# Patient Record
Sex: Male | Born: 1960
Health system: Southern US, Community
[De-identification: ages and names within clinical notes are randomized; demographics above are authoritative.]

## PROBLEM LIST (undated history)

## (undated) DIAGNOSIS — I2510 Atherosclerotic heart disease of native coronary artery without angina pectoris: Secondary | ICD-10-CM

## (undated) DIAGNOSIS — I219 Acute myocardial infarction, unspecified: Secondary | ICD-10-CM

## (undated) DIAGNOSIS — IMO0002 Reserved for concepts with insufficient information to code with codable children: Secondary | ICD-10-CM

## (undated) DIAGNOSIS — E785 Hyperlipidemia, unspecified: Secondary | ICD-10-CM

## (undated) DIAGNOSIS — K219 Gastro-esophageal reflux disease without esophagitis: Secondary | ICD-10-CM

## (undated) HISTORY — DX: Reserved for concepts with insufficient information to code with codable children: IMO0002

## (undated) HISTORY — DX: Atherosclerotic heart disease of native coronary artery without angina pectoris: I25.10

## (undated) HISTORY — DX: Acute myocardial infarction, unspecified: I21.9

## (undated) HISTORY — DX: Gastro-esophageal reflux disease without esophagitis: K21.9

## (undated) HISTORY — DX: Hyperlipidemia, unspecified: E78.5

---

## 2004-03-14 HISTORY — PX: APPENDECTOMY: SHX54

## 2004-12-24 ENCOUNTER — Ambulatory Visit: Payer: Self-pay | Admitting: Family Medicine

## 2005-02-07 ENCOUNTER — Ambulatory Visit: Payer: Self-pay | Admitting: Family Medicine

## 2005-05-09 ENCOUNTER — Ambulatory Visit: Payer: Self-pay | Admitting: Family Medicine

## 2005-08-05 ENCOUNTER — Ambulatory Visit: Payer: Self-pay | Admitting: Family Medicine

## 2005-08-30 ENCOUNTER — Ambulatory Visit: Payer: Self-pay | Admitting: Family Medicine

## 2005-10-10 DIAGNOSIS — I252 Old myocardial infarction: Secondary | ICD-10-CM

## 2005-10-14 ENCOUNTER — Ambulatory Visit: Payer: Self-pay | Admitting: *Deleted

## 2005-10-14 ENCOUNTER — Inpatient Hospital Stay (HOSPITAL_COMMUNITY): Admission: EM | Admit: 2005-10-14 | Discharge: 2005-10-18 | Payer: Self-pay | Admitting: Emergency Medicine

## 2005-10-14 ENCOUNTER — Ambulatory Visit: Payer: Self-pay | Admitting: Family Medicine

## 2005-10-15 ENCOUNTER — Encounter: Payer: Self-pay | Admitting: Internal Medicine

## 2005-10-17 DIAGNOSIS — I219 Acute myocardial infarction, unspecified: Secondary | ICD-10-CM

## 2005-10-17 HISTORY — DX: Acute myocardial infarction, unspecified: I21.9

## 2005-10-26 ENCOUNTER — Ambulatory Visit: Payer: Self-pay | Admitting: Cardiology

## 2005-12-20 DIAGNOSIS — I219 Acute myocardial infarction, unspecified: Secondary | ICD-10-CM

## 2005-12-20 DIAGNOSIS — IMO0002 Reserved for concepts with insufficient information to code with codable children: Secondary | ICD-10-CM | POA: Insufficient documentation

## 2005-12-20 DIAGNOSIS — E785 Hyperlipidemia, unspecified: Secondary | ICD-10-CM

## 2005-12-20 DIAGNOSIS — K219 Gastro-esophageal reflux disease without esophagitis: Secondary | ICD-10-CM

## 2005-12-20 DIAGNOSIS — I2510 Atherosclerotic heart disease of native coronary artery without angina pectoris: Secondary | ICD-10-CM

## 2005-12-20 DIAGNOSIS — Z9861 Coronary angioplasty status: Secondary | ICD-10-CM

## 2005-12-20 HISTORY — DX: Atherosclerotic heart disease of native coronary artery without angina pectoris: I25.10

## 2005-12-20 HISTORY — DX: Reserved for concepts with insufficient information to code with codable children: IMO0002

## 2005-12-20 HISTORY — DX: Acute myocardial infarction, unspecified: I21.9

## 2005-12-20 HISTORY — DX: Hyperlipidemia, unspecified: E78.5

## 2005-12-20 HISTORY — DX: Gastro-esophageal reflux disease without esophagitis: K21.9

## 2006-04-14 ENCOUNTER — Ambulatory Visit: Payer: Self-pay | Admitting: Family Medicine

## 2006-05-29 ENCOUNTER — Encounter: Payer: Self-pay | Admitting: Family Medicine

## 2006-08-31 ENCOUNTER — Encounter: Payer: Self-pay | Admitting: Family Medicine

## 2006-12-04 ENCOUNTER — Encounter: Payer: Self-pay | Admitting: Family Medicine

## 2006-12-04 DIAGNOSIS — E785 Hyperlipidemia, unspecified: Secondary | ICD-10-CM | POA: Insufficient documentation

## 2007-01-25 ENCOUNTER — Ambulatory Visit: Payer: Self-pay | Admitting: Family Medicine

## 2007-04-17 ENCOUNTER — Encounter: Payer: Self-pay | Admitting: Family Medicine

## 2007-12-14 ENCOUNTER — Encounter: Payer: Self-pay | Admitting: Family Medicine

## 2007-12-20 ENCOUNTER — Ambulatory Visit: Payer: Self-pay | Admitting: Family Medicine

## 2008-06-12 ENCOUNTER — Encounter: Payer: Self-pay | Admitting: Family Medicine

## 2009-01-27 ENCOUNTER — Encounter: Payer: Self-pay | Admitting: Family Medicine

## 2009-02-25 DIAGNOSIS — I2510 Atherosclerotic heart disease of native coronary artery without angina pectoris: Secondary | ICD-10-CM | POA: Insufficient documentation

## 2009-04-21 ENCOUNTER — Encounter: Payer: Self-pay | Admitting: Family Medicine

## 2009-10-17 ENCOUNTER — Ambulatory Visit: Payer: Self-pay | Admitting: Emergency Medicine

## 2009-10-17 DIAGNOSIS — J029 Acute pharyngitis, unspecified: Secondary | ICD-10-CM | POA: Insufficient documentation

## 2010-04-13 NOTE — Assessment & Plan Note (Signed)
Summary: cough-yellowish, sorethroat x 2 dys rm 3   Vital Signs:  Patient Profile:   50 Years Old Male CC:      Cold & URI symptoms Height:     64.5 inches Weight:      158 pounds O2 Sat:      100 % O2 treatment:    Room Air Temp:     98.1 degrees F oral Pulse rate:   85 / minute Pulse rhythm:   regular Resp:     16 per minute BP sitting:   114 / 80  (right arm) Cuff size:   regular  Vitals Entered By: Areta Haber CMA (October 17, 2009 4:00 PM)                  Current Allergies: No known allergies History of Present Illness Chief Complaint: Cold & URI symptoms History of Present Illness: ST and HA and mild fever for 2-3 days.  Just flew back from [place] where he was on vacation.  Very sore throat.  Chloraseptic spray and Theraflu and Delsym help a little bit.  No F/C/N/V.  No CP, SOB.  Current Problems: PHARYNGITIS (ICD-462) MYOCARDIAL INFARCTION, INITIAL EPISODE OF CARE (ICD-410.90) KNEE/LEG SPRAIN/STRAIN:, UNSPECIFIED (ICD-844.9) HYPERLIPIDEMIA (ICD-272.4) GASTROESOPHAGEAL REFLUX, NO ESOPHAGITIS (ICD-530.81) BACK PAIN W/RADIATION, UNSPECIFIED (ICD-724.4) ARTERIOSCLEROSIS, CORONARY (ICD-414.00)   Current Meds METOPROLOL SUCCINATE 25 MG TB24 (METOPROLOL SUCCINATE) Take 1 tablet by mouth once a day SIMVASTATIN 40 MG TABS (SIMVASTATIN) Take 1 tablet by mouth once a day NIASPAN 500 MG TBCR (NIACIN (ANTIHYPERLIPIDEMIC)) take two by mouth at bedtime FISH OIL 1000 MG CAPS (OMEGA-3 FATTY ACIDS) two-three tabs by mouth daily ASPIRIN 325 MG TABS (ASPIRIN) Take 1 tablet by mouth once a day MULTIVITAMINS  TABS (MULTIPLE VITAMIN) Take 1 tablet by mouth once a day DELSYM 30 MG/5ML LQCR (DEXTROMETHORPHAN POLISTIREX) as directed THERAFLU FLU & SORE THROAT 20-10-650 MG PACK (PHENIRAMINE-PE-APAP) as directed MEDROL (PAK) 4 MG TABS (METHYLPREDNISOLONE) use as directed AMOXICILLIN 875 MG TABS (AMOXICILLIN) 1 tab by mouth two times a day for 7 days  REVIEW OF  SYSTEMS Constitutional Symptoms      Denies fever, chills, night sweats, weight loss, weight gain, and fatigue.  Eyes       Denies change in vision, eye pain, eye discharge, glasses, contact lenses, and eye surgery. Ear/Nose/Throat/Mouth       Complains of sore throat.      Denies hearing loss/aids, change in hearing, ear pain, ear discharge, dizziness, frequent runny nose, frequent nose bleeds, sinus problems, hoarseness, and tooth pain or bleeding.      Comments: x 2 dys Respiratory       Complains of productive cough.      Denies dry cough, wheezing, shortness of breath, asthma, bronchitis, and emphysema/COPD.  Cardiovascular       Denies murmurs, chest pain, and tires easily with exhertion.    Gastrointestinal       Denies stomach pain, nausea/vomiting, diarrhea, constipation, blood in bowel movements, and indigestion. Genitourniary       Denies painful urination, kidney stones, and loss of urinary control. Neurological       Complains of headaches.      Denies paralysis, seizures, and fainting/blackouts. Musculoskeletal       Denies muscle pain, joint pain, joint stiffness, decreased range of motion, redness, swelling, muscle weakness, and gout.  Skin       Denies bruising, unusual mles/lumps or sores, and hair/skin or nail changes.  Psych  Denies mood changes, temper/anger issues, anxiety/stress, speech problems, depression, and sleep problems. Other Comments: yellowish x 2 dys. Pt has not seen PCP for this.   Past History:  Past Medical History: Last updated: 12/20/2005 Omega - 3 fish oil 1000 mg daily  Past Surgical History: Last updated: 04/14/2006 Appendectomy  cardiac Cath w/ stent EF 55%, mild ant. Akinesis  Family History: Last updated: 04/14/2006 6 brothers- healthy  father- died of heart dz, stroke  mother alive  Social History: Last updated: 12/20/2005 Married -- wife is Therapist, sports in [place].  2 kids age 49 & 5.  Quit smoking in 1997.  Does not  exercise.  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  Moved [place] from [place]  Risk Factors: Smoking Status: quit (04/14/2006) Physical Exam General appearance: well developed, well nourished, no acute distress Ears: normal, no lesions or deformities Nasal: mucosa pink, nonedematous, no septal deviation, turbinates normal Oral/Pharynx: pharyngeal erythema with exudate, uvula midline without deviation Neck: neck supple,  trachea midline, no masses Extremities: normal extremities Neurological: grossly intact and non-focal Skin: no obvious rashes or lesions Assessment New Problems: PHARYNGITIS (ICD-462)   Plan New Medications/Changes: AMOXICILLIN 875 MG TABS (AMOXICILLIN) 1 tab by mouth two times a day for 7 days  #14 x 0, 10/17/2009, Hoyt Koch MD MEDROL (PAK) 4 MG TABS (METHYLPREDNISOLONE) use as directed  #1 pack x 0, 10/17/2009, Hoyt Koch MD  New Orders: New Patient Level II [phone] Planning Comments:   Hydration, rest, Tylenol If not improving in 3-5 days, may start the Amoxicilin then If still not better, Follow-up with your primary care physician   The patient and/or caregiver has been counseled thoroughly with regard to medications prescribed including dosage, schedule, interactions, rationale for use, and possible side effects and they verbalize understanding.  Diagnoses and expected course of recovery discussed and will return if not improved as expected or if the condition worsens. Patient and/or caregiver verbalized understanding.  Prescriptions: AMOXICILLIN 875 MG TABS (AMOXICILLIN) 1 tab by mouth two times a day for 7 days  #14 x 0   Entered and Authorized by:   Hoyt Koch MD   Signed by:   Hoyt Koch MD on 10/17/2009   Method used:   Print then Give to Patient   RxID:   [id number] MEDROL (PAK) 4 MG TABS (METHYLPREDNISOLONE) use as directed  #1 pack x 0   Entered and Authorized by:   Hoyt Koch MD   Signed by:   Hoyt Koch MD on 10/17/2009   Method used:   Print then Give to Patient   RxID:   [id number]   Orders Added: 1)  New Patient Level II [29528]

## 2010-04-13 NOTE — Letter (Signed)
Summary: [facility]  [facility]   Imported By: Lanelle Bal 05/05/2009 14:13:12  _____________________________________________________________________  External Attachment:    Type:   Image     Comment:   External Document

## 2010-04-15 ENCOUNTER — Encounter: Payer: Self-pay | Admitting: Family Medicine

## 2010-05-11 NOTE — Letter (Signed)
Summary: [facility]  [facility]   Imported By: Lanelle Bal 05/06/2010 11:09:20  _____________________________________________________________________  External Attachment:    Type:   Image     Comment:   External Document

## 2010-07-30 NOTE — Assessment & Plan Note (Signed)
[facility]                                   ON-CALL NOTE   NAME:[name], Gregory Frost                     MRN:          [id number]  DATE:10/18/2005                            DOB:          1960-06-28    Primary cardiologist is Dr. Jens Som.   DESCRIPTION OF CALL:  [name] is a very pleasant 50 year old male who  presented to the [facility] late last week with a late presentation  of a lateral MI.  He underwent cardiac catheterization which showed a high-  grade lesion in the second diagonal.  He underwent angioplasty and stenting  of this without complication, and apparently was discharged earlier today.  He called in saying that he had some burning in his chest when he laid down.  This was fairly mild, and totally unlike his previous angina.  He said when  he walks around the burning would totally resolve.  He denies any other  associated symptoms.   DISPOSITION:  I told him that he may have some post-MI pericarditis, and I  have advised him to take an adult aspirin 2 to 3 times a day.  Should the  pain get worse, or he experience any bleeding, I have asked him to call back  or come to the Emergency Room for further evaluation.  Obviously should he  have a more significant   INCOMPLETE                                   Bevelyn Buckles. Bensimhon, MD   DRB/MedQ  DD:  10/18/2005  DT:  10/19/2005  Job #:  [id number]

## 2010-07-30 NOTE — H&P (Signed)
NAME:  Gregory Frost, Gregory Frost NO.:  [id number]   MEDICAL RECORD NO.:  [id number]          PATIENT TYPE:  EMS   LOCATION:  MAJO                         FACILITY:  [facility]   PHYSICIAN:  Vida Roller, M.D.   DATE OF BIRTH:  Jul 02, 1960   DATE OF ADMISSION:  10/14/2005  DATE OF DISCHARGE:                                HISTORY & PHYSICAL   PRIMARY CARE PHYSICIAN:  [facility] [place] and he  sees Dr. Cathey Endow there.   PRIMARY CARDIOLOGIST:  New.  Being seen by Dr. Vida Roller today but  should follow up in [place] with Dr. Jens Som in the future.   PATIENT PROFILE:  A 50 year old Hispanic male with no prior history of CAD  who presents with acute MI.   PROBLEM LIST:  1.  Q-wave myocardial infarction.  2.  Hyperlipidemia.  3.  Gastroesophageal reflux disease.  4.  Status post appendectomy in January 2007.   HISTORY OF PRESENT ILLNESS:  A 50 year old Hispanic male with no prior  history of CAD who is treated for hyperlipidemia.  He is active without  limitations at home and was in his usual state of health until approximately  3 p.m. yesterday, October 13, 2005, when while sitting at his computer he  developed 8/10 substernal chest pressure and squeezing associated with  nausea.  He took Protonix without relief and continued to have chest pain  and nausea as well as vomiting x3 throughout the night.  He did not sleep  much and was very restless and this morning he took Zantac, Maalox and  aspirin with eventual reduction in chest pressure to 5/10.  He had an  appointment already scheduled with his primary care physician, Dr. Cathey Endow at  [facility] in [place], and an EKG was performed  revealing slight ST elevation in leads I and aVL with deep Q's as well as  altered R-wave progression in lead V2.  At that point EMS was called and he  was taken to the [facility] ED.  He was initially on heparin and IV  nitroglycerin and is  currently pain free.   ALLERGIES:  NO KNOWN DRUG ALLERGIES.   HOME MEDICATIONS:  1.  Protonix 40 mg daily.  2.  Vytorin 10/40 mg daily.  3.  Fish oil daily.   FAMILY HISTORY:  Mother is age 31 and alive and well.  Father is in his 81s.  He has history of MI and CABG in his 47s, he has also had a PCI with stent  placement.  He has six brothers, all are alive and well and a couple have  hyperlipidemia.   REVIEW OF SYSTEMS:  Positive for chest pain with nausea as well as vomiting  x3.  All other systems reviewed and negative.   PHYSICAL EXAMINATION:  VITAL SIGNS:  Temperature 98.8, heart rate 90,  respirations 20, blood pressure 143/90, pulse oximetry 96% on room air.  GENERAL:  Pleasant Hispanic male in no acute distress.  Awake, alert and  oriented x3.  NECK:  Normal carotid upstrokes.  No bruits or JVD.  LUNGS:  Respirations  regular and labored.  CARDIAC:  Regular S1 and S2, no S3, S4 or murmurs.  ABDOMEN:  Round, soft, nontender, nondistended.  Bowel sounds present x4.  EXTREMITIES:  Warm, dry, pink.  No clubbing, cyanosis, or edema.  Dorsalis  pedis, posterior tibial pulses 2+ and equal bilaterally.  No femoral bruits  are noted.   STUDIES:  His chest x-ray is pending.  EKG shows sinus rhythm at a rate of  79 beats per minute with a normal axis and altered R-wave progression in  lead II with deep Q-waves in leads I and aVL as well as a very slight ST  elevation in I and aVL.   LABORATORY WORK:  Hemoglobin 16.3, hematocrit 48.0.  Sodium 135, potassium  4.6, chloride 104, CO2 29.3, BUN 11, creatinine 1.1, glucose 113.  CK-MB  greater than 80.0, troponin 13.3.   ASSESSMENT AND PLAN:  1.  Q-wave myocardial infarction.  The patient with symptom onset at 3 p.m.      on October 13, 2005, 24 hours ago, with persistent symptoms throughout the      night and this morning finally relieved with nitroglycerin this      afternoon.  ECG shows slight ST-segment elevation in I and aVL as  well      as deep Q's in those leads as well as altered R-wave in V2.  CK-MB is      greater than 80 by point-of-care markers with a troponin of 13.3.  He is      now pain free.  He likely has completed his infarct.  We will plan to      admit and continue to cycle cardiac markers.  He is currently on heparin      and nitroglycerin.  We will add beta blocker, ACE inhibitor, aspirin,      statin.  He has been seen by the research study nurse for possible      enrollment in SEPIA which is IIb/IIIa versus factor Xa inhibitor,      however the patient has refused.  He is also not interested in being      enrolled in the [place]aman study.  We will initiate IIb/IIIa inhibitor now      to be maintained over the weekend.  Plan for cardiac catheterization on      Monday to evaluate his coronary anatomy or sooner if he has any      recurrent chest pain.  2.  Hyperlipidemia.  Check lipids and LFTs.  We will change him from Vytorin      to Lipitor 80.  3.  Gastroesophageal reflux disease.  Continue proton pump inhibitor.  4.  Hypertension.  His blood pressure is currently elevated.  We will add      low dose beta blocker and ACE inhibitor and titrate as tolerated.      Ok Anis, NP      Vida Roller, M.D.  Electronically Signed    CRB/MEDQ  D:  10/14/2005  T:  10/14/2005  Job:  [id number]

## 2010-07-30 NOTE — Cardiovascular Report (Signed)
NAME:  Gregory Frost, Gregory Frost NO.:  [id number]   MEDICAL RECORD NO.:  [id number]          PATIENT TYPE:  INP   LOCATION:  2904                         FACILITY:  [facility]   PHYSICIAN:  Micheline Chapman, MD   DATE OF BIRTH:  03-May-1960   DATE OF PROCEDURE:  10/17/2005  DATE OF DISCHARGE:                              CARDIAC CATHETERIZATION   PERFORMING PHYSICIAN:  Dr. Tonny Bollman   PROCTORING PHYSICIAN:  Dr. Randa Evens   INDICATION:  [name] is a very pleasant 50 year old male, who  presented late into the presentation of an acute myocardial infarction.  He  appeared to have an isolated lateral wall ST elevation infarct but presented  greater than 24 hours after symptom onset.  He was therefore treated  medically with heparin and eptifibatide and was pain free prior to his  diagnostic catheterization.  Diagnostic coronary angiogram demonstrated a  90% lesion in the first diagonal with that fit with the territory of his  acute MI.  We therefore proceeded with percutaneous coronary intervention.   PROCEDURE:  The patient was on eptifibatide.  Therefore, we continued him on  that medication on fractionated heparin.  After achieving a therapeutic ACT,  we proceeded with intervention using a 6 French Q 3.5 mm guiding catheter.  A Prowater wire was used to cross the lesion in the first diagonal without  difficulty.  We initially dilated with a 2.0 x 15 mm balloon and had to  inflate up to 10 atmospheres to get good balloon expansion.  Following  initial balloon dilatation, there appeared to be a dissection in the  diagonal, and we thus proceeded with stenting this vessel with a 2.0 x 18 mm  MiniVision stent up to 6 atmospheres.   There was a good angiographic result after stent deployment, and we  proceeded to postdilate the stent with a 2.0 x 12 mm noncompliant  postdilatation balloon.  This was taken to 16 atmospheres.  At the  conclusion of the intervention,  there was 0% residual stenosis.  There was  TIMI 3 flow in the vessel, and the right femoral artery was closed with a 6  [place] Angioseal device.  There were no immediate complications.      Micheline Chapman, MD  Electronically Signed     MDC/MEDQ  D:  10/17/2005  T:  10/17/2005  Job:  [id number]

## 2010-07-30 NOTE — Assessment & Plan Note (Signed)
[facility]                              CARDIOLOGY OFFICE NOTE   NAME:Gregory Frost, Gregory Frost                     MRN:          [id number]  DATE:10/26/2005                            DOB:          04-07-1960    Gregory Frost is a pleasant 50 year old male who was recently admitted to  [facility] following an out-of-hospital myocardial infarction.  He  underwent cardiac catheterization on October 17, 2005.  There was a 90%  diagonal with otherwise nonobstructive disease.  Ejection fraction was 65%  with mid anterior akinesis.  He subsequently underwent a bare metal stent to  the second diagonal and tolerated the procedure well.  Also of note, the  patient did have an echocardiogram on October 15, 2005.  His ejection fraction  was 50%, and there was akinesis of the basal lateral wall and hypokinesis of  the mid and distal interseptal wall.  There was no significant valvular  abnormalities noted.  Since discharge, he has done well. There is no  dyspnea, chest pain, palpitations, or syncope.   PRESENT MEDICATIONS:  1. Aspirin 325 mg p.o. daily.  2. Plavix 75 mg p.o. daily.  3. Vitorin 10/40 mg p.o. nightly.  4. Toprol 25 mg p.o. daily.  5. Fish oil.  6. Multivitamins.   PHYSICAL EXAMINATION:  VITAL SIGNS: Blood pressure 113/74, pulse 71.  He  weighs 148 pounds.  NECK:  Supple.  CHEST: Clear.  CARDIOVASCULAR:  Regular rate and rhythm.  EXTREMITIES: Showed no edema.  Right groin shows no hematoma, and there is  no bruit noted.  There is mild ecchymosis.   Electrocardiogram shows normal sinus rhythm at a rate of 68.  The axis is  normal.  There is anterior and lateral T wave inversion and evidence of a  prior lateral infarct.   DIAGNOSES:  1. Coronary artery disease status post bare metal stent to the second      diagonal.  2. Hyperlipidemia.  3. Gastroesophageal reflux disease.   PLAN:  Gregory Frost is doing well from a symptomatic  standpoint with no  chest pain or shortness of breath.  His blood pressure is well controlled,  and he does not smoke.  We will continue with his present medications.  He  will return for fasting lipids and liver in 2 weeks, and  we will adjust his regimen with a goal LDL of less than 70 given his history  of coronary artery disease. We discussed the importance of diet and  exercise.  He will see [place] back in 6 months.                              Gregory Frost Gregory Som, MD, [facility]    BSC/MedQ  DD:  10/26/2005  DT:  10/26/2005  Job #:  [id number]   cc:   Gregory Bars, DO

## 2010-07-30 NOTE — Discharge Summary (Signed)
NAMEJAHVON, Gregory Frost NO.:  [id number]   MEDICAL RECORD NO.:  [id number]          PATIENT TYPE:  INP   LOCATION:  2904                         FACILITY:  [facility]   PHYSICIAN:  Willa Rough, M.D.     DATE OF BIRTH:  September 21, 1960   DATE OF ADMISSION:  10/14/2005  DATE OF DISCHARGE:  10/18/2005                                 DISCHARGE SUMMARY   PROCEDURES:  1.  Cardiac catheterization.  2.  Coronary arteriogram.  3.  Left ventriculogram.  4.  PTCA and bare-metal stent to one vessel.   Time at discharge 38 minutes.   PRIMARY DIAGNOSES:  1.  Late presentation for an out-of-hospital ST-segment-elevation myocardial      infarction.  2.  Hyperlipidemia with a total cholesterol of 247, triglycerides 227, HDL      40, LDL 162.  3.  Hypotension, captopril discontinued and a low dose of beta blocker used.  4.  Gastroesophageal reflux disease symptoms.  5.  Status post appendectomy.  6.  Family history of premature coronary artery disease.   HOSPITAL COURSE:  Mr. Gregory Frost is a 50 year old male with no previous  history of coronary artery disease.  He came to the hospital on October 14, 2005, for chest pain.  He had had 8/10 substernal chest pain the day prior  to admission that was associated with nausea.  He took Protonix without  relief.  He had vomiting x3 throughout the night.  On the day of admission  he took Zantac as well as Maalox.  He then took aspirin which decreased his  pain.  His pain was a 5/10 when he went to his primary care physician and  was given sublingual nitroglycerin which also decreased his pain.  He had  EKG changes in his lateral leads and was sent to the emergency room.  He was  admitted for further evaluation and treatment.   His cardiac enzymes were elevated indicating an out-of-hospital MI.  He was  stabilized on aspirin, nitroglycerin, heparin and scheduled for cardiac  catheterization which was performed on October 17, 2005.   The cardiac  catheterization showed the culprit vessel to be a second  diagonal with a 90% stenosis.  He had a separate ostium for the RCA and 50%  stenosis in that vessel.  There was a 30% stenosis in the LAD and a 40%  stenosis in the first diagonal.  His EF was 55% with mid anterior akinesis.  It was felt that percutaneous intervention was indicated.   Mr. Gregory Frost had a bare-metal stent inserted into the second diagonal,  reducing the stenosis to zero with TIMI 3 flow.  He tolerated the procedure  well.   Mr. Gregory Frost had been on Vytorin 10/40 prior to admission but only for a  few weeks.  Initially, he was switched to Lipitor 80 mg a day but it was  felt that he could go back on the Vytorin 10/40 at discharge and follow up  with his family physician and Dr. Jens Som.  Further medication changes can  be made as an outpatient.  Mr. Gregory Frost was seen by  cardiac rehab and given  information on MI signs and symptoms, use of sublingual nitroglycerin, and  calling 9-1-1. Activity progression was also discussed.  Mr. 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   On October 18, 2005, Mr. Gregory Frost was ambulating without chest pain or  shortness of breath.  He was evaluated by Dr. Myrtis Ser and considered stable for  discharge with outpatient followup arranged.   DISCHARGE INSTRUCTIONS:  1.  His activity level is to include no driving for several days and no      lifting for 2 weeks.  2.  He is to stick to a diet that is low in salt, fat and cholesterol.  3.  He is to call our office for any problems with the catheterization site.  4.  He is to bring all his medications with him to his office visit.  5.  He is to follow up with Dr. Jens Som on October 26, 2005, at 2:30 p.m. in      the [facility] office.  6.  He is to follow up with Dr. Cathey Endow as needed.   DISCHARGE MEDICATIONS:  1.  Aspirin 325 mg daily.  2.   Sublingual nitroglycerin p.r.n.  3.  Plavix 75 mg daily for at least 30 days (duration of Plavix to be      determined by Dr. Jens Som).  4.  Vytorin 10/40 mg daily.  5.  Toprol-XL 25 mg daily.      Theodore Demark, P.A. [facility]    ______________________________  Willa Rough, M.D.    RB/MEDQ  D:  10/18/2005  T:  10/18/2005  Job:  [id number]   cc:   Seymour Bars, D.O.

## 2010-07-30 NOTE — Cardiovascular Report (Signed)
NAMEDRESHAUN, STENE NO.:  [id number]   MEDICAL RECORD NO.:  [id number]          PATIENT TYPE:  INP   LOCATION:  2904                         FACILITY:  [facility]   PHYSICIAN:  Salvadore Farber, M.D. LHCDATE OF BIRTH:  09-24-1960   DATE OF PROCEDURE:  10/17/2005  DATE OF DISCHARGE:                              CARDIAC CATHETERIZATION   HISTORY OF PRESENT ILLNESS:  Mr. Roskos is a 50 year old gentleman with  hypercholesterolemia with no other risk factors for coronary artery disease.  He presented on October 14, 2005 having an episode of severe chest pain 24  hours prior to presentation.  He was found to have lateral ST elevations.  He was pain-free.  He was maintained on medical therapy over the weekend  without recurrence of pain and presents today for angiography and possible  coronary intervention.   PROCEDURAL TECHNIQUE:  Informed consent was obtained.  Under 1% lidocaine  local anesthesia, a 5-French sheath was placed in the right common femoral  artery using the modified Seldinger technique.  Diagnostic angiography was  performed using JL4, Williams right, and pigtail catheters.   COMPLICATIONS:  None.   FINDINGS:  1. LV:  96/8/10.  EF 55% with mid-anterior akinesis.  2. No aortic stenosis or mitral regurgitation.  3. Left main:  Angiographically normal.  4. LAD:  Fairly large vessel giving rise to two diagonal branches.  The      mid LAD has a 30% stenosis.  The second diagonal branch has a proximal      90% stenosis.  First diagonal has a 40% stenosis in its mid section.  5. Circumflex:  Moderate sized codominant vessel giving rise to an obtuse      marginal and a small PDA.  It has only minor luminal irregularities.  6. RCA:  Moderate sized codominant vessel.  There is a 50% stenosis in the      mid vessel.   IMPRESSION/PLAN:  The severe stenosis of the second diagonal branch is  clearly the culprit.  Will proceed to percutaneous  intervention.      Salvadore Farber, M.D. [facility]  Electronically Signed     WED/MEDQ  D:  10/17/2005  T:  10/17/2005  Job:  [id number]   cc:   Olga Millers, M.D. [facility]  Seymour Bars, D.O.

## 2015-11-05 DIAGNOSIS — K080 Exfoliation of teeth due to systemic causes: Secondary | ICD-10-CM | POA: Diagnosis not present

## 2016-02-22 DIAGNOSIS — K080 Exfoliation of teeth due to systemic causes: Secondary | ICD-10-CM | POA: Diagnosis not present

## 2016-08-16 ENCOUNTER — Ambulatory Visit (INDEPENDENT_AMBULATORY_CARE_PROVIDER_SITE_OTHER): Payer: Federal, State, Local not specified - PPO | Admitting: Family Medicine

## 2016-08-16 ENCOUNTER — Encounter: Payer: Self-pay | Admitting: Family Medicine

## 2016-08-16 ENCOUNTER — Encounter (INDEPENDENT_AMBULATORY_CARE_PROVIDER_SITE_OTHER): Payer: Self-pay

## 2016-08-16 VITALS — BP 124/76 | HR 103 | Ht 62.99 in | Wt 156.0 lb

## 2016-08-16 DIAGNOSIS — E782 Mixed hyperlipidemia: Secondary | ICD-10-CM

## 2016-08-16 DIAGNOSIS — R0683 Snoring: Secondary | ICD-10-CM

## 2016-08-16 DIAGNOSIS — I2510 Atherosclerotic heart disease of native coronary artery without angina pectoris: Secondary | ICD-10-CM | POA: Diagnosis not present

## 2016-08-16 MED ORDER — ATORVASTATIN CALCIUM 40 MG PO TABS: 40.0000 mg | ORAL_TABLET | Freq: Every day | ORAL | 3 refills | Status: DC

## 2016-08-16 MED ORDER — NITROGLYCERIN 0.4 MG SL SUBL: 0.4000 mg | SUBLINGUAL_TABLET | SUBLINGUAL | 99 refills | Status: DC | PRN

## 2016-08-16 NOTE — Patient Instructions (Signed)
I did send a prescription for atorvastatin which is generic for Lipitor. After you get your blood work I like for you to try this for a couple months and then we can recheck your liver enzymes on the new medication.

## 2016-08-16 NOTE — Progress Notes (Addendum)
Subjective:    Patient ID: Gregory Frost, male    DOB: 1960/05/30, 56 y.o.   MRN: [id number]  HPI 56 year old male comes in today to establish care. His wife has been a patient here for several years.   He reports that he had a heart attack about 10 years ago in August 2007. He had a cardiac catheterization at that time. He did have stents placed. I will have to get full details on this. He followed with the lipid clinic for several years up until approximately 2014. He has been off of Vytorin for about 3 years but does still take a full 325 mg aspirin daily. At that point it sounds like the lipid clinic was dissolved but he was never seen again by cardiologist and picked fact he has never had a stress test since his actual catheterization 10 years ago. He denies any recent chest pain or shortness of breath.    Patient also complains of snoring. His wife wanted him to be evaluated for sleep apnea. He also has daytime fatigue. And has had witnessed apnea. No family history of sleep apnea.   Review of Systems  Constitutional: Positive for fatigue. Negative for diaphoresis, fever and unexpected weight change.  HENT: Negative for hearing loss, rhinorrhea, sneezing and tinnitus.   Eyes: Negative for visual disturbance.  Respiratory: Negative for cough and wheezing.   Cardiovascular: Negative for chest pain and palpitations.  Gastrointestinal: Negative for blood in stool, diarrhea, nausea and vomiting.  Genitourinary: Negative for discharge and dysuria.  Musculoskeletal: Negative for arthralgias and myalgias.  Skin: Negative for rash.  Neurological: Negative for headaches.  Hematological: Negative for adenopathy.  Psychiatric/Behavioral: Negative for dysphoric mood and sleep disturbance. The patient is not nervous/anxious.      BP 124/76   Pulse (!) 103   Ht 5' 2.99" (1.6 m)   Wt 156 lb (70.8 kg)   SpO2 98%   BMI 27.64 kg/m     No Known Allergies  Past Medical History:   Diagnosis Date  . MI (myocardial infarction) (HCC) 10/17/2005    Past Surgical History:  Procedure Laterality Date  . APPENDECTOMY  2006    Social History   Social History  . Marital status: Married    Spouse name: Dr. Eden Lathe  . Number of children: 2  . Years of education: BA   Occupational History  . Operations Administrator     [employer]   Social History Main Topics  . Smoking status: Former Smoker    Types: Cigarettes    Quit date: 03/14/1988  . Smokeless tobacco: Never Used  . Alcohol use 2.4 oz/week    2 Glasses of wine, 2 Cans of beer per week  . Drug use: No  . Sexual activity: Yes    Partners: Female   Other Topics Concern  . Not on file   Social History Narrative   2 caffeinated drinks daily. Typically walks for about 20 minutes 5 days per week.    Family History  Problem Relation Age of Onset  . Heart attack Father   . Hyperlipidemia Father   . Hyperlipidemia Brother     Outpatient Encounter Prescriptions as of 08/16/2016  Medication Sig  . aspirin 325 MG tablet Take 325 mg by mouth daily.  Marland Kitchen KRILL OIL PO Take 750 mg by mouth.  . Milk Thistle 500 MG CAPS Take by mouth.  . Multiple Vitamin (MULTIVITAMIN) capsule Take 1 capsule by mouth daily.  Marland Kitchen Ubiquinol 100 MG  CAPS Take by mouth.  Marland Kitchen. atorvastatin (LIPITOR) 40 MG tablet Take 1 tablet (40 mg total) by mouth daily.  . nitroGLYCERIN (NITROSTAT) 0.4 MG SL tablet Place 1 tablet (0.4 mg total) under the tongue every 5 (five) minutes as needed for chest pain.   No facility-administered encounter medications on file as of 08/16/2016.           Objective:   Physical Exam  Constitutional: He is oriented to person, place, and time. He appears well-developed and well-nourished.  HENT:  Head: Normocephalic and atraumatic.  Right Ear: External ear normal.  Left Ear: External ear normal.  Nose: Nose normal.  Mouth/Throat: Oropharynx is clear and moist.  TMs and canals are clear.   Eyes: Conjunctivae  and EOM are normal. Pupils are equal, round, and reactive to light.  Neck: Neck supple. No thyromegaly present.  Cardiovascular: Normal rate and normal heart sounds.   Neck carotid bruits.  Pulmonary/Chest: Effort normal and breath sounds normal.  Lymphadenopathy:    He has no cervical adenopathy.  Neurological: He is alert and oriented to person, place, and time.  Skin: Skin is warm and dry.  Psychiatric: He has a normal mood and affect.          Assessment & Plan:  Coronary artery disease-discussed the importance of getting him back on a statin. Will start with atorvastatin. Continue with aspirin daily. That he could actually probably be switched to a baby aspirin. Will refer to cardiology as I do think he probably needs some stress testing done at this point since he is 10 years out from cardiac catheterization. Blood pressure looks fantastic today.  EKG today shows rate of 73 bpm with normal sinus rhythm. Evidence of old septal infarct.  Hyperlipidemia-due to recheck lipid levels. Restart a statin.  Snoring-stopping questionnaire score positive for 4. This is high enough risk that he should be screened for sleep apnea.

## 2016-08-17 ENCOUNTER — Encounter: Payer: Self-pay | Admitting: Family Medicine

## 2016-08-22 DIAGNOSIS — I2510 Atherosclerotic heart disease of native coronary artery without angina pectoris: Secondary | ICD-10-CM | POA: Diagnosis not present

## 2016-08-22 LAB — CBC WITH DIFFERENTIAL/PLATELET
BASOS PCT: 0 %
Basophils Absolute: 0 cells/uL (ref 0–200)
EOS ABS: 201 {cells}/uL (ref 15–500)
Eosinophils Relative: 3 %
HEMATOCRIT: 42.5 % (ref 38.5–50.0)
HEMOGLOBIN: 14.1 g/dL (ref 13.2–17.1)
LYMPHS ABS: 1943 {cells}/uL (ref 850–3900)
Lymphocytes Relative: 29 %
MCH: 30.3 pg (ref 27.0–33.0)
MCHC: 33.2 g/dL (ref 32.0–36.0)
MCV: 91.2 fL (ref 80.0–100.0)
MONO ABS: 670 {cells}/uL (ref 200–950)
MPV: 11.5 fL (ref 7.5–12.5)
Monocytes Relative: 10 %
Neutro Abs: 3886 cells/uL (ref 1500–7800)
Neutrophils Relative %: 58 %
Platelets: 282 10*3/uL (ref 140–400)
RBC: 4.66 MIL/uL (ref 4.20–5.80)
RDW: 13.6 % (ref 11.0–15.0)
WBC: 6.7 10*3/uL (ref 3.8–10.8)

## 2016-08-23 LAB — COMPLETE METABOLIC PANEL WITH GFR
ALBUMIN: 4.4 g/dL (ref 3.6–5.1)
ALK PHOS: 70 U/L (ref 40–115)
ALT: 38 U/L (ref 9–46)
AST: 25 U/L (ref 10–35)
BILIRUBIN TOTAL: 0.6 mg/dL (ref 0.2–1.2)
BUN: 21 mg/dL (ref 7–25)
CALCIUM: 9.2 mg/dL (ref 8.6–10.3)
CHLORIDE: 104 mmol/L (ref 98–110)
CO2: 23 mmol/L (ref 20–31)
CREATININE: 1.09 mg/dL (ref 0.70–1.33)
GFR, Est African American: 88 mL/min (ref >=60)
GFR, Est Non African American: 77 mL/min (ref >=60)
Glucose, Bld: 99 mg/dL (ref 65–99)
Potassium: 4.1 mmol/L (ref 3.5–5.3)
Sodium: 140 mmol/L (ref 135–146)
TOTAL PROTEIN: 7.3 g/dL (ref 6.1–8.1)

## 2016-08-23 LAB — LIPID PANEL W/REFLEX DIRECT LDL
CHOLESTEROL: 234 mg/dL — AB (ref <200)
HDL: 35 mg/dL — ABNORMAL LOW (ref >40)
NON-HDL CHOLESTEROL (CALC): 199 mg/dL — AB (ref <130)
Total CHOL/HDL Ratio: 6.7 Ratio — ABNORMAL HIGH (ref <5.0)
Triglycerides: 412 mg/dL — ABNORMAL HIGH (ref <150)

## 2016-08-23 LAB — LDL CHOLESTEROL, DIRECT: Direct LDL: 142 mg/dL — ABNORMAL HIGH (ref <100)

## 2016-08-23 LAB — PSA: PSA: 1.3 ng/mL (ref <=4.0)

## 2016-08-23 NOTE — Addendum Note (Signed)
Addended by: Deno EtienneBARKLEY, Elasha Tess L on: 08/23/2016 08:08 AM   Modules accepted: Orders

## 2016-09-07 DIAGNOSIS — K080 Exfoliation of teeth due to systemic causes: Secondary | ICD-10-CM | POA: Diagnosis not present

## 2016-09-19 NOTE — Progress Notes (Signed)
  Referring-Catherine Metheney, MD Reason for referral-CAD  HPI: 55 yo male for evaluation of CAD at request of Catherine Metheney, MD. Seen previously but not since 8/07. Had out-of-hospital myocardial infarction and underwent cardiac catheterization on October 17, 2005. There was a 90% diagonal with otherwise nonobstructive disease. Ejection fraction was 65% with mid anterior akinesis. He subsequently underwent a bare metal stent to the second diagonal. Echocardiogram 8/07 showed EF 50% and there was akinesis of the basal lateral wall and hypokinesis of the mid and distal interseptal wall. There was no significant valvular abnormalities noted. Patient has occasional vague discomfort in his left upper chest not related to exertion. No associated symptoms. He does not have exertional chest pain, dyspnea on exertion, orthopnea, PND or pedal edema.  Current Outpatient Prescriptions  Medication Sig Dispense Refill  . aspirin 325 MG tablet Take 325 mg by mouth daily.    . atorvastatin (LIPITOR) 40 MG tablet Take 1 tablet (40 mg total) by mouth daily. (Patient taking differently: Take 80 mg by mouth daily. ) 90 tablet 3  . KRILL OIL PO Take 750 mg by mouth.    . Milk Thistle 500 MG CAPS Take by mouth.    . Multiple Vitamin (MULTIVITAMIN) capsule Take 1 capsule by mouth daily.    . nitroGLYCERIN (NITROSTAT) 0.4 MG SL tablet Place 1 tablet (0.4 mg total) under the tongue every 5 (five) minutes as needed for chest pain. 12 tablet prn  . Ubiquinol 100 MG CAPS Take by mouth.     No current facility-administered medications for this visit.     No Known Allergies   Past Medical History:  Diagnosis Date  . BACK PAIN W/RADIATION, UNSPECIFIED 12/20/2005   Qualifier: Diagnosis of  By: Bowen DO, Karen    . CAD in native artery 12/20/2005   Qualifier: Diagnosis of  By: Bowen DO, Karen    . GASTROESOPHAGEAL REFLUX, NO ESOPHAGITIS 12/20/2005   Qualifier: Diagnosis of  By: Bowen DO, Karen    . HYPERLIPIDEMIA  12/20/2005   Qualifier: Diagnosis of  By: Bowen DO, Karen    . MI (myocardial infarction) (HCC) 10/17/2005  . MYOCARDIAL INFARCTION, INITIAL EPISODE OF CARE 12/20/2005   Qualifier: Diagnosis of  By: Bowen DO, Karen      Past Surgical History:  Procedure Laterality Date  . APPENDECTOMY  2006    Social History   Social History  . Marital status: Married    Spouse name: Dr. Aida Castillo  . Number of children: 2  . Years of education: BA   Occupational History  . Operations Administrator     [employer]   Social History Main Topics  . Smoking status: Former Smoker    Types: Cigarettes    Quit date: 03/14/1988  . Smokeless tobacco: Never Used  . Alcohol use 2.4 oz/week    2 Glasses of wine, 2 Cans of beer per week  . Drug use: No  . Sexual activity: Yes    Partners: Female   Other Topics Concern  . Not on file   Social History Narrative   2 caffeinated drinks daily. Typically walks for about 20 minutes 5 days per week.    Family History  Problem Relation Age of Onset  . Heart attack Father   . Hyperlipidemia Father   . Hyperlipidemia Brother     ROS: Fatigue but no fevers or chills, productive cough, hemoptysis, dysphasia, odynophagia, melena, hematochezia, dysuria, hematuria, rash, seizure activity, orthopnea, PND, pedal edema, claudication. Remaining systems   are negative.  Physical Exam:   Blood pressure (!) 153/104, pulse 92, height 5\' 4"  (1.626 m), weight 70.8 kg (156 lb).  General:  Well developed/well nourished in NAD Skin warm/dry Patient not depressed No peripheral clubbing Back-normal HEENT-normal/normal eyelids Neck supple/normal carotid upstroke bilaterally; no bruits; no JVD; no thyromegaly chest - CTA/ normal expansion CV - RRR/normal S1 and S2; no murmurs, rubs or gallops;  PMI nondisplaced Abdomen -NT/ND, no HSM, no mass, + bowel sounds, no bruit 2+ femoral pulses, no bruits Ext-no edema, chords, 2+ DP Neuro-grossly nonfocal  ECG -  08/17/2016-sinus rhythm, left axis deviation, left ventricular hypertrophy, cannot rule out prior septal infarct. personally reviewed  A/P  1 chest discomfort-vague symptoms that are not related to exertion. Arrange stress nuclear study for risk stratification.  2 coronary artery disease-continue aspirin and statin.  3 hyperlipidemia-patient was recently reinitiated on Lipitor 80 mg daily. Follow-up laboratories have been ordered at primary care.  4 hypertension-blood pressure is mildly elevated. Add Toprol 25 mg daily and follow.  Olga MillersBrian Crenshaw, MD

## 2016-09-27 ENCOUNTER — Encounter: Payer: Self-pay | Admitting: Family Medicine

## 2016-09-27 ENCOUNTER — Ambulatory Visit (INDEPENDENT_AMBULATORY_CARE_PROVIDER_SITE_OTHER): Payer: Federal, State, Local not specified - PPO | Admitting: Family Medicine

## 2016-09-27 VITALS — BP 138/86 | HR 85 | Ht 64.0 in | Wt 156.0 lb

## 2016-09-27 DIAGNOSIS — E782 Mixed hyperlipidemia: Secondary | ICD-10-CM | POA: Diagnosis not present

## 2016-09-27 DIAGNOSIS — R0683 Snoring: Secondary | ICD-10-CM

## 2016-09-27 DIAGNOSIS — Z1211 Encounter for screening for malignant neoplasm of colon: Secondary | ICD-10-CM

## 2016-09-27 DIAGNOSIS — Z0000 Encounter for general adult medical examination without abnormal findings: Secondary | ICD-10-CM | POA: Diagnosis not present

## 2016-09-27 DIAGNOSIS — Z23 Encounter for immunization: Secondary | ICD-10-CM | POA: Diagnosis not present

## 2016-09-27 NOTE — Progress Notes (Signed)
Subjective:    CC: CPE  HPI:  56 year old measures his today for complete physical exam. He has no specific concerns or complaints. He did go for full blood work back in June. At that time his cluster levels were elevated. LDL was 142 and triglycerides were 412. He has known history of atherosclerosis with a history of MI. In fact he has cardiology appointment coming up this he has not had any type of further testing including stress testing done since his original cardiac event 11 years ago. He does try to walk for exercise about 4 days per week. He said as a minimum he usually gets in about 5000 steps a day but does try to walk for a total of 30 minutes. He has not had any colon cancer screening done.  Past medical history, Surgical history, Family history not pertinant except as noted below, Social history, Allergies, and medications have been entered into the medical record, reviewed, and corrections made.   Review of Systems: No fevers, chills, night sweats, weight loss, chest pain, or shortness of breath.   Objective:    Physical Exam  Constitutional: He is oriented to person, place, and time. He appears well-developed and well-nourished.  HENT:  Head: Normocephalic and atraumatic.  Right Ear: External ear normal.  Left Ear: External ear normal.  Nose: Nose normal.  Mouth/Throat: Oropharynx is clear and moist.  Eyes: Pupils are equal, round, and reactive to light. Conjunctivae and EOM are normal.  Neck: Normal range of motion. Neck supple. No thyromegaly present.  Cardiovascular: Normal rate, regular rhythm, normal heart sounds and intact distal pulses.   Pulmonary/Chest: Effort normal and breath sounds normal.  Abdominal: Soft. Bowel sounds are normal. He exhibits no distension and no mass. There is no tenderness. There is no rebound and no guarding.  Musculoskeletal: Normal range of motion.  Lymphadenopathy:    He has no cervical adenopathy.  Neurological: He is alert and  oriented to person, place, and time. He has normal reflexes.  Skin: Skin is warm and dry.  Psychiatric: He has a normal mood and affect. His behavior is normal. Judgment and thought content normal.      Impression and Recommendations:    CPE Keep up a regular exercise program and make sure you are eating a healthy diet Try to eat 4 servings of dairy a day, or if you are lactose intolerant take a calcium with vitamin D daily.  Your vaccines are up to date.  Cologuard form completed.  Declined colonoscopy. Tdap given today.    Elevated LDL with history of coronary artery disease- Discussed options. Will increase Lipitor to 80 mg for 30 days and recheck liver and lipids at that time. He says that one time he had elevated liver levels on Vytorin so he was a little bit nervous about going up on the Lipitor but he is willing to try for 30 days. He does have an appointment with cardiology coming up saying. We also discussed that he might benefit from a glycerides lowering agent such as the Vascepa or Lovaza. For now we'll just focus on getting his LDL down to goal.

## 2016-09-27 NOTE — Patient Instructions (Signed)
Keep up a regular exercise program and make sure you are eating a healthy diet Try to eat 4 servings of dairy a day, or if you are lactose intolerant take a calcium with vitamin D daily.  Your vaccines are up to date.   

## 2016-09-29 ENCOUNTER — Ambulatory Visit (INDEPENDENT_AMBULATORY_CARE_PROVIDER_SITE_OTHER): Payer: Federal, State, Local not specified - PPO | Admitting: Cardiology

## 2016-09-29 ENCOUNTER — Encounter: Payer: Self-pay | Admitting: Cardiology

## 2016-09-29 VITALS — BP 153/104 | HR 92 | Ht 64.0 in | Wt 156.0 lb

## 2016-09-29 DIAGNOSIS — E7800 Pure hypercholesterolemia, unspecified: Secondary | ICD-10-CM

## 2016-09-29 DIAGNOSIS — I10 Essential (primary) hypertension: Secondary | ICD-10-CM | POA: Diagnosis not present

## 2016-09-29 DIAGNOSIS — I2510 Atherosclerotic heart disease of native coronary artery without angina pectoris: Secondary | ICD-10-CM

## 2016-09-29 DIAGNOSIS — R072 Precordial pain: Secondary | ICD-10-CM | POA: Diagnosis not present

## 2016-09-29 MED ORDER — METOPROLOL SUCCINATE ER 25 MG PO TB24: 25.0000 mg | ORAL_TABLET | Freq: Every day | ORAL | 3 refills | Status: DC

## 2016-09-29 NOTE — Patient Instructions (Signed)
Medication Instructions:   START METOPROLOL SUCC ER 25 MG ONCE DAILY AT BEDTIME  Testing/Procedures:  Your physician has requested that you have en exercise stress myoview. For further information please visit [email or web address]. Please follow instruction sheet, as given.DO NOT TAKE METOPROLOL THE MORNING OF YOUR STRESS TEST    Follow-Up:  Your physician wants you to follow-up in: ONE YEAR WITH DR Jens SomRENSHAW IN [place] You will receive a reminder letter in the mail two months in advance. If you don't receive a letter, please call our office to schedule the follow-up appointment.   If you need a refill on your cardiac medications before your next appointment, please call your pharmacy.

## 2016-09-30 ENCOUNTER — Telehealth (HOSPITAL_COMMUNITY): Payer: Self-pay

## 2016-09-30 NOTE — Telephone Encounter (Signed)
Encounter complete. 

## 2016-10-05 ENCOUNTER — Ambulatory Visit (HOSPITAL_COMMUNITY)
Admission: RE | Admit: 2016-10-05 | Discharge: 2016-10-05 | Disposition: A | Discharge status: Home / Self Care | Payer: Federal, State, Local not specified - PPO | Source: Ambulatory Visit | Attending: Cardiovascular Disease | Admitting: Cardiovascular Disease

## 2016-10-05 DIAGNOSIS — I2510 Atherosclerotic heart disease of native coronary artery without angina pectoris: Secondary | ICD-10-CM

## 2016-10-05 LAB — MYOCARDIAL PERFUSION IMAGING
CHL CUP NUCLEAR SSS: 12
CHL RATE OF PERCEIVED EXERTION: 18
CSEPEDS: 40 s
Estimated workload: 12.8 METS
Exercise duration (min): 10 min
LVDIAVOL: 96 mL (ref 62–150)
LVSYSVOL: 52 mL
MPHR: 166 {beats}/min
NUC STRESS TID: 0.95
Peak HR: 164 {beats}/min
Percent HR: 98 %
Rest HR: 65 {beats}/min
SDS: 9
SRS: 3

## 2016-10-05 MED ORDER — TECHNETIUM TC 99M TETROFOSMIN IV KIT
28.4000 | PACK | Freq: Once | INTRAVENOUS | Status: AC | PRN
  Administered 2016-10-05: 28.4 via INTRAVENOUS
  Filled 2016-10-05: qty 29

## 2016-10-05 MED ORDER — TECHNETIUM TC 99M TETROFOSMIN IV KIT
9.9000 | PACK | Freq: Once | INTRAVENOUS | Status: AC | PRN
  Administered 2016-10-05: 9.9 via INTRAVENOUS
  Filled 2016-10-05: qty 10

## 2016-10-06 ENCOUNTER — Telehealth: Payer: Self-pay | Admitting: *Deleted

## 2016-10-06 DIAGNOSIS — Z01812 Encounter for preprocedural laboratory examination: Secondary | ICD-10-CM

## 2016-10-06 DIAGNOSIS — I2510 Atherosclerotic heart disease of native coronary artery without angina pectoris: Secondary | ICD-10-CM

## 2016-10-06 NOTE — Telephone Encounter (Signed)
Called patient, aware of results and recommendations. Patient reports he is suppose to be going of town next week.   Reviewed with Dr. Sw[place]Jordan, DOD and advised patient that he recommends having cath next week prior to going out of town.    Patient request the earliest in the week as possible.    Called cath lab-scheduled for Monday 7/30 with Dr. Herbie BaltimoreHarding at 12pm, patient should be there at 9:30 in short stay.   Attempt to contact patient back to notify-lmtcb.  Patient will need labs tomorrow and can pick instructions up/review at that time. Letter created.        Patient returned call-aware [facility] and will review instructions at that time.

## 2016-10-06 NOTE — Telephone Encounter (Signed)
High risk stress test-spoke to DOD, patient needs to be set up for cath.     Attempted to call patient, left message to call back on home and cell.

## 2016-10-07 DIAGNOSIS — Z01812 Encounter for preprocedural laboratory examination: Secondary | ICD-10-CM | POA: Diagnosis not present

## 2016-10-07 DIAGNOSIS — I2510 Atherosclerotic heart disease of native coronary artery without angina pectoris: Secondary | ICD-10-CM | POA: Diagnosis not present

## 2016-10-07 LAB — CBC
HEMATOCRIT: 43.2 % (ref 37.5–51.0)
HEMOGLOBIN: 14.6 g/dL (ref 13.0–17.7)
MCH: 31.3 pg (ref 26.6–33.0)
MCHC: 33.8 g/dL (ref 31.5–35.7)
MCV: 93 fL (ref 79–97)
Platelets: 279 10*3/uL (ref 150–379)
RBC: 4.66 x10E6/uL (ref 4.14–5.80)
RDW: 13.1 % (ref 12.3–15.4)
WBC: 6.6 10*3/uL (ref 3.4–10.8)

## 2016-10-07 LAB — PROTIME-INR
INR: 1 (ref 0.8–1.2)
Prothrombin Time: 10.5 s (ref 9.1–12.0)

## 2016-10-07 LAB — BASIC METABOLIC PANEL
BUN / CREAT RATIO: 18 (ref 9–20)
BUN: 18 mg/dL (ref 6–24)
CO2: 22 mmol/L (ref 20–29)
CREATININE: 1 mg/dL (ref 0.76–1.27)
Calcium: 9.1 mg/dL (ref 8.7–10.2)
Chloride: 103 mmol/L (ref 96–106)
GFR, EST AFRICAN AMERICAN: 98 mL/min/{1.73_m2} (ref >59)
GFR, EST NON AFRICAN AMERICAN: 85 mL/min/{1.73_m2} (ref >59)
Glucose: 103 mg/dL — ABNORMAL HIGH (ref 65–99)
POTASSIUM: 4.6 mmol/L (ref 3.5–5.2)
SODIUM: 141 mmol/L (ref 134–144)

## 2016-10-10 ENCOUNTER — Ambulatory Visit (HOSPITAL_COMMUNITY)
Admission: RE | Admit: 2016-10-10 | Discharge: 2016-10-10 | Disposition: A | Discharge status: Home / Self Care | Payer: Federal, State, Local not specified - PPO | Source: Ambulatory Visit | Attending: Cardiology | Admitting: Cardiology

## 2016-10-10 ENCOUNTER — Ambulatory Visit (HOSPITAL_COMMUNITY)
Admission: RE | Disposition: A | Discharge status: Home / Self Care | Payer: Self-pay | Source: Ambulatory Visit | Attending: Cardiology

## 2016-10-10 ENCOUNTER — Ambulatory Visit: Payer: Federal, State, Local not specified - PPO | Admitting: Nurse Practitioner

## 2016-10-10 DIAGNOSIS — Z9861 Coronary angioplasty status: Secondary | ICD-10-CM

## 2016-10-10 DIAGNOSIS — E785 Hyperlipidemia, unspecified: Secondary | ICD-10-CM | POA: Diagnosis not present

## 2016-10-10 DIAGNOSIS — I2582 Chronic total occlusion of coronary artery: Secondary | ICD-10-CM | POA: Insufficient documentation

## 2016-10-10 DIAGNOSIS — K219 Gastro-esophageal reflux disease without esophagitis: Secondary | ICD-10-CM | POA: Insufficient documentation

## 2016-10-10 DIAGNOSIS — Z7982 Long term (current) use of aspirin: Secondary | ICD-10-CM | POA: Insufficient documentation

## 2016-10-10 DIAGNOSIS — Z8249 Family history of ischemic heart disease and other diseases of the circulatory system: Secondary | ICD-10-CM | POA: Insufficient documentation

## 2016-10-10 DIAGNOSIS — I2510 Atherosclerotic heart disease of native coronary artery without angina pectoris: Secondary | ICD-10-CM | POA: Diagnosis not present

## 2016-10-10 DIAGNOSIS — R9439 Abnormal result of other cardiovascular function study: Secondary | ICD-10-CM | POA: Diagnosis present

## 2016-10-10 DIAGNOSIS — Z87891 Personal history of nicotine dependence: Secondary | ICD-10-CM | POA: Insufficient documentation

## 2016-10-10 DIAGNOSIS — I252 Old myocardial infarction: Secondary | ICD-10-CM

## 2016-10-10 DIAGNOSIS — I25119 Atherosclerotic heart disease of native coronary artery with unspecified angina pectoris: Secondary | ICD-10-CM

## 2016-10-10 DIAGNOSIS — I10 Essential (primary) hypertension: Secondary | ICD-10-CM | POA: Insufficient documentation

## 2016-10-10 HISTORY — PX: LEFT HEART CATH AND CORONARY ANGIOGRAPHY: CATH118249

## 2016-10-10 SURGERY — LEFT HEART CATH AND CORONARY ANGIOGRAPHY
Anesthesia: LOCAL

## 2016-10-10 MED ORDER — SODIUM CHLORIDE 0.9 % IV SOLN: INTRAVENOUS | Status: DC

## 2016-10-10 MED ORDER — IOPAMIDOL (ISOVUE-370) INJECTION 76%
INTRAVENOUS | Status: DC | PRN
  Administered 2016-10-10: 65 mL via INTRA_ARTERIAL

## 2016-10-10 MED ORDER — SODIUM CHLORIDE 0.9 % WEIGHT BASED INFUSION
3.0000 mL/kg/h | INTRAVENOUS | Status: AC
  Administered 2016-10-10: 3 mL/kg/h via INTRAVENOUS

## 2016-10-10 MED ORDER — ASPIRIN 81 MG PO CHEW
81.0000 mg | CHEWABLE_TABLET | ORAL | Status: AC
  Administered 2016-10-10: 81 mg via ORAL

## 2016-10-10 MED ORDER — CLOPIDOGREL BISULFATE 75 MG PO TABS: 75.0000 mg | ORAL_TABLET | Freq: Every day | ORAL | 11 refills | Status: DC

## 2016-10-10 MED ORDER — MIDAZOLAM HCL 2 MG/2ML IJ SOLN
INTRAMUSCULAR | Status: DC | PRN
  Administered 2016-10-10: 2 mg via INTRAVENOUS

## 2016-10-10 MED ORDER — FENTANYL CITRATE (PF) 100 MCG/2ML IJ SOLN
INTRAMUSCULAR | Status: DC | PRN
  Administered 2016-10-10: 50 ug via INTRAVENOUS

## 2016-10-10 MED ORDER — HEPARIN SODIUM (PORCINE) 1000 UNIT/ML IJ SOLN
INTRAMUSCULAR | Status: DC | PRN
  Administered 2016-10-10: 4000 [IU] via INTRAVENOUS

## 2016-10-10 MED ORDER — SODIUM CHLORIDE 0.9 % IV SOLN: 250.0000 mL | INTRAVENOUS | Status: DC | PRN

## 2016-10-10 MED ORDER — MORPHINE SULFATE (PF) 4 MG/ML IV SOLN: 2.0000 mg | INTRAVENOUS | Status: DC | PRN

## 2016-10-10 MED ORDER — FENTANYL CITRATE (PF) 100 MCG/2ML IJ SOLN
INTRAMUSCULAR | Status: AC
  Filled 2016-10-10: qty 2

## 2016-10-10 MED ORDER — HEPARIN (PORCINE) IN NACL 2-0.9 UNIT/ML-% IJ SOLN
INTRAMUSCULAR | Status: AC | PRN
  Administered 2016-10-10: 1000 mL

## 2016-10-10 MED ORDER — HEPARIN (PORCINE) IN NACL 2-0.9 UNIT/ML-% IJ SOLN
INTRAMUSCULAR | Status: AC
  Filled 2016-10-10: qty 1000

## 2016-10-10 MED ORDER — SODIUM CHLORIDE 0.9% FLUSH: 3.0000 mL | Freq: Two times a day (BID) | INTRAVENOUS | Status: DC

## 2016-10-10 MED ORDER — LIDOCAINE HCL (PF) 1 % IJ SOLN
INTRAMUSCULAR | Status: AC
  Filled 2016-10-10: qty 30

## 2016-10-10 MED ORDER — MIDAZOLAM HCL 2 MG/2ML IJ SOLN
INTRAMUSCULAR | Status: AC
  Filled 2016-10-10: qty 2

## 2016-10-10 MED ORDER — LIDOCAINE HCL (PF) 1 % IJ SOLN
INTRAMUSCULAR | Status: DC | PRN
  Administered 2016-10-10: 2 mL

## 2016-10-10 MED ORDER — VERAPAMIL HCL 2.5 MG/ML IV SOLN
INTRAVENOUS | Status: AC
  Filled 2016-10-10: qty 2

## 2016-10-10 MED ORDER — HEPARIN SODIUM (PORCINE) 1000 UNIT/ML IJ SOLN
INTRAMUSCULAR | Status: AC
  Filled 2016-10-10: qty 1

## 2016-10-10 MED ORDER — ONDANSETRON HCL 4 MG/2ML IJ SOLN: 4.0000 mg | Freq: Four times a day (QID) | INTRAMUSCULAR | Status: DC | PRN

## 2016-10-10 MED ORDER — SODIUM CHLORIDE 0.9 % WEIGHT BASED INFUSION: 1.0000 mL/kg/h | INTRAVENOUS | Status: DC

## 2016-10-10 MED ORDER — SODIUM CHLORIDE 0.9% FLUSH: 3.0000 mL | INTRAVENOUS | Status: DC | PRN

## 2016-10-10 MED ORDER — IOPAMIDOL (ISOVUE-370) INJECTION 76%
INTRAVENOUS | Status: AC
  Filled 2016-10-10: qty 100

## 2016-10-10 MED ORDER — ACETAMINOPHEN 325 MG PO TABS: 650.0000 mg | ORAL_TABLET | ORAL | Status: DC | PRN

## 2016-10-10 MED ORDER — ASPIRIN 81 MG PO CHEW
CHEWABLE_TABLET | ORAL | Status: AC
  Administered 2016-10-10: 81 mg via ORAL
  Filled 2016-10-10: qty 1

## 2016-10-10 MED ORDER — VERAPAMIL HCL 2.5 MG/ML IV SOLN
INTRAVENOUS | Status: DC | PRN
  Administered 2016-10-10: 10 mL via INTRA_ARTERIAL

## 2016-10-10 SURGICAL SUPPLY — 11 items
CATH INFINITI 5FR ANG PIGTAIL (CATHETERS) ×1 IMPLANT
CATH INFINITI JR4 5F (CATHETERS) ×1 IMPLANT
CATH OPTITORQUE TIG 4.0 5F (CATHETERS) ×1 IMPLANT
DEVICE RAD COMP TR BAND LRG (VASCULAR PRODUCTS) ×1 IMPLANT
GLIDESHEATH SLEND A-KIT 6F 22G (SHEATH) ×1 IMPLANT
GUIDEWIRE INQWIRE 1.5J.035X260 (WIRE) IMPLANT
INQWIRE 1.5J .035X260CM (WIRE) ×2
KIT HEART LEFT (KITS) ×2 IMPLANT
PACK CARDIAC CATHETERIZATION (CUSTOM PROCEDURE TRAY) ×2 IMPLANT
TRANSDUCER W/STOPCOCK (MISCELLANEOUS) ×2 IMPLANT
TUBING CIL FLEX 10 FLL-RA (TUBING) ×2 IMPLANT

## 2016-10-10 NOTE — H&P (View-Only) (Signed)
Referring-Gregory Linford ArnoldMetheney, MD Reason for referral-CAD  HPI: 56 yo male for evaluation of CAD at request of Nani Gasseratherine Metheney, MD. Seen previously but not since 8/07. Had out-of-hospital myocardial infarction and underwent cardiac catheterization on October 17, 2005. There was a 90% diagonal with otherwise nonobstructive disease. Ejection fraction was 65% with mid anterior akinesis. He subsequently underwent a bare metal stent to the second diagonal. Echocardiogram 8/07 showed EF 50% and there was akinesis of the basal lateral wall and hypokinesis of the mid and distal interseptal wall. There was no significant valvular abnormalities noted. Patient has occasional vague discomfort in his left upper chest not related to exertion. No associated symptoms. He does not have exertional chest pain, dyspnea on exertion, orthopnea, PND or pedal edema.  Current Outpatient Prescriptions  Medication Sig Dispense Refill  . aspirin 325 MG tablet Take 325 mg by mouth daily.    Marland Kitchen. atorvastatin (LIPITOR) 40 MG tablet Take 1 tablet (40 mg total) by mouth daily. (Patient taking differently: Take 80 mg by mouth daily. ) 90 tablet 3  . KRILL OIL PO Take 750 mg by mouth.    . Milk Thistle 500 MG CAPS Take by mouth.    . Multiple Vitamin (MULTIVITAMIN) capsule Take 1 capsule by mouth daily.    . nitroGLYCERIN (NITROSTAT) 0.4 MG SL tablet Place 1 tablet (0.4 mg total) under the tongue every 5 (five) minutes as needed for chest pain. 12 tablet prn  . Ubiquinol 100 MG CAPS Take by mouth.     No current facility-administered medications for this visit.     No Known Allergies   Past Medical History:  Diagnosis Date  . BACK PAIN W/RADIATION, UNSPECIFIED 12/20/2005   Qualifier: Diagnosis of  By: Thomos LemonsBowen DO, Karen    . CAD in native artery 12/20/2005   Qualifier: Diagnosis of  By: Thomos LemonsBowen DO, Karen    . GASTROESOPHAGEAL REFLUX, NO ESOPHAGITIS 12/20/2005   Qualifier: Diagnosis of  By: Thomos LemonsBowen DO, Karen    . HYPERLIPIDEMIA  12/20/2005   Qualifier: Diagnosis of  By: Thomos LemonsBowen DO, Karen    . MI (myocardial infarction) (HCC) 10/17/2005  . MYOCARDIAL INFARCTION, INITIAL EPISODE OF CARE 12/20/2005   Qualifier: Diagnosis of  By: Thomos LemonsBowen DO, Karen      Past Surgical History:  Procedure Laterality Date  . APPENDECTOMY  2006    Social History   Social History  . Marital status: Married    Spouse name: Dr. Eden LatheAida Castillo  . Number of children: 2  . Years of education: BA   Occupational History  . Operations Administrator     [employer]   Social History Main Topics  . Smoking status: Former Smoker    Types: Cigarettes    Quit date: 03/14/1988  . Smokeless tobacco: Never Used  . Alcohol use 2.4 oz/week    2 Glasses of wine, 2 Cans of beer per week  . Drug use: No  . Sexual activity: Yes    Partners: Female   Other Topics Concern  . Not on file   Social History Narrative   2 caffeinated drinks daily. Typically walks for about 20 minutes 5 days per week.    Family History  Problem Relation Age of Onset  . Heart attack Father   . Hyperlipidemia Father   . Hyperlipidemia Brother     ROS: Fatigue but no fevers or chills, productive cough, hemoptysis, dysphasia, odynophagia, melena, hematochezia, dysuria, hematuria, rash, seizure activity, orthopnea, PND, pedal edema, claudication. Remaining systems  are negative.  Physical Exam:   Blood pressure (!) 153/104, pulse 92, height 5\' 4"  (1.626 m), weight 70.8 kg (156 lb).  General:  Well developed/well nourished in NAD Skin warm/dry Patient not depressed No peripheral clubbing Back-normal HEENT-normal/normal eyelids Neck supple/normal carotid upstroke bilaterally; no bruits; no JVD; no thyromegaly chest - CTA/ normal expansion CV - RRR/normal S1 and S2; no murmurs, rubs or gallops;  PMI nondisplaced Abdomen -NT/ND, no HSM, no mass, + bowel sounds, no bruit 2+ femoral pulses, no bruits Ext-no edema, chords, 2+ DP Neuro-grossly nonfocal  ECG -  08/17/2016-sinus rhythm, left axis deviation, left ventricular hypertrophy, cannot rule out prior septal infarct. personally reviewed  A/P  1 chest discomfort-vague symptoms that are not related to exertion. Arrange stress nuclear study for risk stratification.  2 coronary artery disease-continue aspirin and statin.  3 hyperlipidemia-patient was recently reinitiated on Lipitor 80 mg daily. Follow-up laboratories have been ordered at primary care.  4 hypertension-blood pressure is mildly elevated. Add Toprol 25 mg daily and follow.  Olga MillersBrian Crenshaw, MD

## 2016-10-10 NOTE — Discharge Instructions (Signed)
Radial Site Care Refer to this sheet in the next few weeks. These instructions provide you with information about caring for yourself after your procedure. Your health care provider may also give you more specific instructions. Your treatment has been planned according to current medical practices, but problems sometimes occur. Call your health care provider if you have any problems or questions after your procedure. What can I expect after the procedure? After your procedure, it is typical to have the following:  Bruising at the radial site that usually fades within 1-2 weeks.  Blood collecting in the tissue (hematoma) that may be painful to the touch. It should usually decrease in size and tenderness within 1-2 weeks.  Follow these instructions at home:  Take medicines only as directed by your health care provider.  You may shower 24-48 hours after the procedure or as directed by your health care provider. Remove the bandage (dressing) and gently wash the site with plain soap and water. Pat the area dry with a clean towel. Do not rub the site, because this may cause bleeding.  Do not take baths, swim, or use a hot tub until your health care provider approves.  Check your insertion site every day for redness, swelling, or drainage.  Do not apply powder or lotion to the site.  Do not flex or bend the affected arm for 24 hours or as directed by your health care provider.  Do not push or pull heavy objects with the affected arm for 24 hours or as directed by your health care provider.  Do not lift over 10 lb (4.5 kg) for 5 days after your procedure or as directed by your health care provider.  Ask your health care provider when it is okay to: ? Return to work or school. ? Resume usual physical activities or sports. ? Resume sexual activity.  Do not drive home if you are discharged the same day as the procedure. Have someone else drive you.  You may drive 24 hours after the procedure  unless otherwise instructed by your health care provider.  Do not operate machinery or power tools for 24 hours after the procedure.  If your procedure was done as an outpatient procedure, which means that you went home the same day as your procedure, a responsible adult should be with you for the first 24 hours after you arrive home.  Keep all follow-up visits as directed by your health care provider. This is important. Contact a health care provider if:  You have a fever.  You have chills.  You have increased bleeding from the radial site. Hold pressure on the site. Get help right away if:  You have unusual pain at the radial site.  You have redness, warmth, or swelling at the radial site.  You have drainage (other than a small amount of blood on the dressing) from the radial site.  The radial site is bleeding, and the bleeding does not stop after 30 minutes of holding steady pressure on the site call 911  Your arm or hand becomes pale, cool, tingly, or numb. This information is not intended to replace advice given to you by your health care provider. Make sure you discuss any questions you have with your health care provider. Document Released: 04/02/2010 Document Revised: 08/06/2015 Document Reviewed: 09/16/2013 Elsevier Interactive Patient Education  2018 [employer]

## 2016-10-10 NOTE — Interval H&P Note (Signed)
History and Physical Interval Note:  10/10/2016 2:05 PM  Rutherford LimerickJavier Perlow  has presented today for surgery, with the diagnosis of abnormal nuc - HIGH RISK.  The various methods of treatment have been discussed with the patient and family. After consideration of risks, benefits and other options for treatment, the patient has consented to  Procedure(s): Left Heart Cath and Coronary Angiography (N/A) with possible Percutaneous Coronary Intervention as a surgical intervention .    The patient's history has been reviewed, patient examined, no change in status, stable for surgery.  I have reviewed the patient's chart and labs.  Questions were answered to the patient's satisfaction.    Cath Lab Visit (complete for each Cath Lab visit)  Clinical Evaluation Leading to the Procedure:   ACS: No.  Non-ACS:    Anginal Classification: CCS II  Anti-ischemic medical therapy: No Therapy  Non-Invasive Test Results: High-risk stress test findings: cardiac mortality >3%/year  Prior CABG: No previous CABG   Bryan Lemmaavid Harding

## 2016-10-11 ENCOUNTER — Encounter (HOSPITAL_COMMUNITY): Payer: Self-pay | Admitting: Cardiology

## 2016-10-11 ENCOUNTER — Telehealth: Payer: Self-pay | Admitting: *Deleted

## 2016-10-11 MED FILL — Nitroglycerin IV Soln 100 MCG/ML in D5W: INTRA_ARTERIAL | Qty: 10 | Status: AC

## 2016-10-11 NOTE — Telephone Encounter (Signed)
I did tell her that Dr. Linford Arnoldmetheney is out of the office this week.She insisted this be taken care of today.

## 2016-10-11 NOTE — Telephone Encounter (Signed)
Notified patient's wife

## 2016-10-11 NOTE — Telephone Encounter (Signed)
Patient's wife called and requested that patient be prescribed vascepa and crestor in stead of lipitor. She states his triglycerides are over 400 and his lipids are high. He just had a heart cath yesterday. Please advise

## 2016-10-11 NOTE — Telephone Encounter (Signed)
I would ask that they contact their cardiologist, if the cardiologist wanted him to be on a specific treatment I imagine they would have placed on this and there may be a good reason they did not. Otherwise, please schedule hospital follow-up *with PCP* to address this issue.

## 2016-10-12 ENCOUNTER — Telehealth: Payer: Self-pay | Admitting: Cardiology

## 2016-10-12 DIAGNOSIS — Z79899 Other long term (current) drug therapy: Secondary | ICD-10-CM

## 2016-10-12 DIAGNOSIS — E785 Hyperlipidemia, unspecified: Secondary | ICD-10-CM

## 2016-10-12 LAB — COLOGUARD: COLOGUARD: NEGATIVE

## 2016-10-12 MED ORDER — ROSUVASTATIN CALCIUM 40 MG PO TABS: 40.0000 mg | ORAL_TABLET | Freq: Every day | ORAL | 3 refills | Status: DC

## 2016-10-12 NOTE — Telephone Encounter (Signed)
Dc lipitor; crestor 40 mg daily; lipids and liver 4 weeks Brian Crenshaw  

## 2016-10-12 NOTE — Telephone Encounter (Signed)
Called patient with MD recommendations. After speaking with Stanton Kidneyebra, RN regarding request for Vascepa, informed patient that MD does not generally Rx fibrates/Rx-strength fish oil/omegas. Patient asked about his 8/20 appointment. Explained that MD did not recommend changes to this. Informed him that PA can change meds/treatment plan/order testing and procedures just as the doctor can and they work closely with the MDs to formulate treatment plan changes/update the primary cardiologist.  Rx(s) sent to pharmacy electronically. Labs ordered/mailed to patient.

## 2016-10-12 NOTE — Telephone Encounter (Signed)
New message   Pt wife is calling wanting pt lipitor changed. She said his triglycerides are elevated. She wants the medications changed until he sees the doctor.

## 2016-10-12 NOTE — Telephone Encounter (Signed)
Returned call to patient regarding 2 concerns.  1. He wants to make an appointment with Dr. Jens Somrenshaw ASAP post cath - done yesterday, no intervention - possible CTO if recurrent symptoms per Dr. Herbie BaltimoreHarding cath note. He has an appointment with Randall AnBrittany Strader, PA on 8/20 - he wanted to know if PA can make changes to his treatment plan, just as how the doctor would. Explained that PA can make changes to meds/treatment plan as the MD can, and does so in consultation with MD when needed. Patient is wanted to know if he will need to have another procedure/surgery?  2. He is requesting a change in his medication(s) and he requested I speak with wife. He is on lipitor currently and has 2 arteries completely stenosed. She states patient has muscle aches on lipitor and he has high triglycerides. She would like him on crestor and vascepa. They are will to pay out of pocket for this medication if needed. She states they called PCP today to request med change as well, and PCP deferred to cardiologist.   Patient is leaving on vacation tomorrow so they are "pressed" for time. Verified pharmacy on file is correct  Advised will defer to MD/RN for review

## 2016-10-19 ENCOUNTER — Telehealth: Payer: Self-pay | Admitting: Family Medicine

## 2016-10-19 NOTE — Telephone Encounter (Signed)
Call pt: cologuard was negative. Repeat colon cancer screening in 3 years.  

## 2016-10-19 NOTE — Telephone Encounter (Signed)
Left message on patient vm that cologuard was negative, advised patient to call back if he had any questions. Lulani Bour,CMA

## 2016-10-26 DIAGNOSIS — K080 Exfoliation of teeth due to systemic causes: Secondary | ICD-10-CM | POA: Diagnosis not present

## 2016-10-28 ENCOUNTER — Encounter: Payer: Self-pay | Admitting: Family Medicine

## 2016-10-31 ENCOUNTER — Ambulatory Visit: Payer: Federal, State, Local not specified - PPO | Admitting: Student

## 2016-11-22 DIAGNOSIS — E782 Mixed hyperlipidemia: Secondary | ICD-10-CM | POA: Diagnosis not present

## 2016-11-22 LAB — LIPID PANEL W/REFLEX DIRECT LDL
CHOLESTEROL: 145 mg/dL (ref <200)
HDL: 38 mg/dL — ABNORMAL LOW (ref >40)
LDL Cholesterol (Calc): 75 mg/dL (calc)
Non-HDL Cholesterol (Calc): 107 mg/dL (calc) (ref <130)
TRIGLYCERIDES: 227 mg/dL — AB (ref <150)
Total CHOL/HDL Ratio: 3.8 (calc) (ref <5.0)

## 2016-11-22 LAB — COMPLETE METABOLIC PANEL WITH GFR
AG RATIO: 1.8 (calc) (ref 1.0–2.5)
ALKALINE PHOSPHATASE (APISO): 60 U/L (ref 40–115)
ALT: 44 U/L (ref 9–46)
AST: 21 U/L (ref 10–35)
Albumin: 4.7 g/dL (ref 3.6–5.1)
BILIRUBIN TOTAL: 0.4 mg/dL (ref 0.2–1.2)
BUN: 19 mg/dL (ref 7–25)
CHLORIDE: 105 mmol/L (ref 98–110)
CO2: 27 mmol/L (ref 20–32)
Calcium: 9.7 mg/dL (ref 8.6–10.3)
Creat: 0.96 mg/dL (ref 0.70–1.33)
GFR, EST NON AFRICAN AMERICAN: 89 mL/min/{1.73_m2} (ref >=60)
GFR, Est African American: 103 mL/min/{1.73_m2} (ref >=60)
GLOBULIN: 2.6 g/dL (ref 1.9–3.7)
Glucose, Bld: 108 mg/dL — ABNORMAL HIGH (ref 65–99)
POTASSIUM: 4.5 mmol/L (ref 3.5–5.3)
Sodium: 140 mmol/L (ref 135–146)
Total Protein: 7.3 g/dL (ref 6.1–8.1)

## 2016-11-28 DIAGNOSIS — I10 Essential (primary) hypertension: Secondary | ICD-10-CM | POA: Diagnosis not present

## 2016-11-28 DIAGNOSIS — I25119 Atherosclerotic heart disease of native coronary artery with unspecified angina pectoris: Secondary | ICD-10-CM | POA: Diagnosis not present

## 2016-12-09 ENCOUNTER — Telehealth: Payer: Self-pay | Admitting: *Deleted

## 2016-12-09 MED ORDER — ICOSAPENT ETHYL 1 G PO CAPS: 2.0000 g | ORAL_CAPSULE | Freq: Two times a day (BID) | ORAL | 99 refills | Status: DC

## 2016-12-09 NOTE — Telephone Encounter (Signed)
Patient's wife called and wants patient to be on vascepa in addition to crestor. She wants a prescription sent in to her pharm

## 2016-12-09 NOTE — Telephone Encounter (Signed)
Left VM with recommendation, callback provided for any questions.

## 2016-12-09 NOTE — Telephone Encounter (Signed)
Prescription sent to [facility]. It may require prior authorization so she may want to call before going to pick it up just to make sure that it's ready. Prior authorizations can take a couple of days.

## 2016-12-14 DIAGNOSIS — I2510 Atherosclerotic heart disease of native coronary artery without angina pectoris: Secondary | ICD-10-CM | POA: Diagnosis not present

## 2016-12-19 ENCOUNTER — Telehealth: Payer: Self-pay | Admitting: Cardiology

## 2016-12-19 NOTE — Telephone Encounter (Signed)
Received records from [facility] for appointment on 01/11/17 with Dr Jens Som.  Records put with Dr Ludwig Clarks schedule for 01/11/17. lp

## 2016-12-28 ENCOUNTER — Encounter: Payer: Self-pay | Admitting: Cardiology

## 2016-12-28 DIAGNOSIS — K080 Exfoliation of teeth due to systemic causes: Secondary | ICD-10-CM | POA: Diagnosis not present

## 2017-01-03 NOTE — Progress Notes (Deleted)
HPI: FU CAD. Had out-of-hospital myocardial infarction and underwent cardiac catheterization on October 17, 2005. There was a 90% diagonal with otherwise nonobstructive disease. Ejection fraction was 65% with mid anterior akinesis. He subsequently underwent a bare metal stent to the second diagonal. Echocardiogram 8/07 showed EF 50% and there was akinesis of the basal lateral wall and hypokinesis of the mid and distal interseptal wall. There was no significant valvular abnormalities noted. Patient had a nuclear study July 2018 related to vague chest pain. Ejection fraction 46%. There was ischemia in the anterior wall and apex. Patient then underwent cardiac catheterization July 2018 and showed an occluded right coronary artery, 70% mid circumflex, occluded second diagonal at site of previously placed stent and ejection fraction 45-50%. Patient treated medically. If symptoms persist could consider PCI of circumflex and CTO attempt of RCA. Since last seen   Current Outpatient Prescriptions  Medication Sig Dispense Refill  . clopidogrel (PLAVIX) 75 MG tablet Take 1 tablet (75 mg total) by mouth daily. 30 tablet 11  . Icosapent Ethyl (VASCEPA) 1 g CAPS Take 2 g by mouth 2 (two) times daily. 360 capsule PRN  . Krill Oil 500 MG CAPS Take 500 mg by mouth daily.     . metoprolol succinate (TOPROL XL) 25 MG 24 hr tablet Take 1 tablet (25 mg total) by mouth daily. 90 tablet 3  . Milk Thistle 500 MG CAPS Take 1 capsule by mouth daily.     . Multiple Vitamin (MULTIVITAMIN) capsule Take 1 capsule by mouth daily.    . nitroGLYCERIN (NITROSTAT) 0.4 MG SL tablet Place 1 tablet (0.4 mg total) under the tongue every 5 (five) minutes as needed for chest pain. 12 tablet prn  . rosuvastatin (CRESTOR) 40 MG tablet Take 1 tablet (40 mg total) by mouth daily. 90 tablet 3  . Ubiquinol 100 MG CAPS Take 1 tablet by mouth daily.     . vitamin C (ASCORBIC ACID) 500 MG tablet Take 500 mg by mouth daily.     No current  facility-administered medications for this visit.      Past Medical History:  Diagnosis Date  . BACK PAIN Frost/RADIATION, UNSPECIFIED 12/20/2005   Qualifier: Diagnosis of  By: Thomos LemonsBowen DO, Karen    . CAD in native artery 12/20/2005   Qualifier: Diagnosis of  By: Thomos LemonsBowen DO, Karen    . GASTROESOPHAGEAL REFLUX, NO ESOPHAGITIS 12/20/2005   Qualifier: Diagnosis of  By: Thomos LemonsBowen DO, Karen    . HYPERLIPIDEMIA 12/20/2005   Qualifier: Diagnosis of  By: Thomos LemonsBowen DO, Karen    . MI (myocardial infarction) (HCC) 10/17/2005  . MYOCARDIAL INFARCTION, INITIAL EPISODE OF CARE 12/20/2005   Qualifier: Diagnosis of  By: Thomos LemonsBowen DO, Karen      Past Surgical History:  Procedure Laterality Date  . APPENDECTOMY  2006  . LEFT HEART CATH AND CORONARY ANGIOGRAPHY N/A 10/10/2016   Procedure: Left Heart Cath and Coronary Angiography;  Surgeon: Gregory Frost, Gregory W, MD;  [facility];  Service: Cardiovascular;  Laterality: N/A;    Social History   Social History  . Marital status: Married    Spouse name: Dr. Eden LatheAida Frost  . Number of children: 2  . Years of education: BA   Occupational History  . Operations Administrator     [employer]   Social History Main Topics  . Smoking status: Former Smoker    Types: Cigarettes    Quit date: 03/14/1988  . Smokeless tobacco: Never Used  .  Alcohol use 2.4 oz/week    2 Glasses of wine, 2 Cans of beer per week  . Drug use: No  . Sexual activity: Yes    Partners: Female   Other Topics Concern  . Not on file   Social History Narrative   2 caffeinated drinks daily. Typically walks for about 20 minutes 5 days per week.    Family History  Problem Relation Age of Onset  . Heart attack Father   . Hyperlipidemia Father   . Hyperlipidemia Brother     ROS: no fevers or chills, productive cough, hemoptysis, dysphasia, odynophagia, melena, hematochezia, dysuria, hematuria, rash, seizure activity, orthopnea, PND, pedal edema, claudication. Remaining systems are  negative.  Physical Exam: Well-developed well-nourished in no acute distress.  Skin is warm and dry.  HEENT is normal.  Neck is supple.  Chest is clear to auscultation with normal expansion.  Cardiovascular exam is regular rate and rhythm.  Abdominal exam nontender or distended. No masses palpated. Extremities show no edema. neuro grossly intact  ECG- personally reviewed  A/P  1  Gregory Millers, MD

## 2017-01-11 ENCOUNTER — Ambulatory Visit: Payer: Federal, State, Local not specified - PPO | Admitting: Cardiology

## 2017-01-13 DIAGNOSIS — R9431 Abnormal electrocardiogram [ECG] [EKG]: Secondary | ICD-10-CM | POA: Diagnosis not present

## 2017-01-13 DIAGNOSIS — Z01818 Encounter for other preprocedural examination: Secondary | ICD-10-CM | POA: Diagnosis not present

## 2017-01-13 DIAGNOSIS — I2510 Atherosclerotic heart disease of native coronary artery without angina pectoris: Secondary | ICD-10-CM | POA: Diagnosis not present

## 2017-01-13 DIAGNOSIS — Z01812 Encounter for preprocedural laboratory examination: Secondary | ICD-10-CM | POA: Diagnosis not present

## 2017-01-13 DIAGNOSIS — Z951 Presence of aortocoronary bypass graft: Secondary | ICD-10-CM | POA: Diagnosis not present

## 2017-01-13 DIAGNOSIS — I252 Old myocardial infarction: Secondary | ICD-10-CM | POA: Diagnosis not present

## 2017-01-13 DIAGNOSIS — Z01810 Encounter for preprocedural cardiovascular examination: Secondary | ICD-10-CM | POA: Diagnosis not present

## 2017-01-13 DIAGNOSIS — Z01811 Encounter for preprocedural respiratory examination: Secondary | ICD-10-CM | POA: Diagnosis not present

## 2017-01-16 DIAGNOSIS — I252 Old myocardial infarction: Secondary | ICD-10-CM | POA: Diagnosis not present

## 2017-01-16 DIAGNOSIS — E785 Hyperlipidemia, unspecified: Secondary | ICD-10-CM | POA: Diagnosis not present

## 2017-01-16 DIAGNOSIS — Z452 Encounter for adjustment and management of vascular access device: Secondary | ICD-10-CM | POA: Diagnosis not present

## 2017-01-16 DIAGNOSIS — I081 Rheumatic disorders of both mitral and tricuspid valves: Secondary | ICD-10-CM | POA: Diagnosis not present

## 2017-01-16 DIAGNOSIS — Z7982 Long term (current) use of aspirin: Secondary | ICD-10-CM | POA: Diagnosis not present

## 2017-01-16 DIAGNOSIS — K219 Gastro-esophageal reflux disease without esophagitis: Secondary | ICD-10-CM | POA: Diagnosis not present

## 2017-01-16 DIAGNOSIS — R739 Hyperglycemia, unspecified: Secondary | ICD-10-CM | POA: Diagnosis not present

## 2017-01-16 DIAGNOSIS — I10 Essential (primary) hypertension: Secondary | ICD-10-CM | POA: Diagnosis not present

## 2017-01-16 DIAGNOSIS — I2510 Atherosclerotic heart disease of native coronary artery without angina pectoris: Secondary | ICD-10-CM | POA: Diagnosis not present

## 2017-01-16 DIAGNOSIS — Z4682 Encounter for fitting and adjustment of non-vascular catheter: Secondary | ICD-10-CM | POA: Diagnosis not present

## 2017-01-16 DIAGNOSIS — G4730 Sleep apnea, unspecified: Secondary | ICD-10-CM | POA: Diagnosis not present

## 2017-01-16 DIAGNOSIS — I25118 Atherosclerotic heart disease of native coronary artery with other forms of angina pectoris: Secondary | ICD-10-CM | POA: Diagnosis not present

## 2017-01-16 DIAGNOSIS — J90 Pleural effusion, not elsewhere classified: Secondary | ICD-10-CM | POA: Diagnosis not present

## 2017-01-16 DIAGNOSIS — Z7902 Long term (current) use of antithrombotics/antiplatelets: Secondary | ICD-10-CM | POA: Diagnosis not present

## 2017-01-16 DIAGNOSIS — Z79899 Other long term (current) drug therapy: Secondary | ICD-10-CM | POA: Diagnosis not present

## 2017-01-17 DIAGNOSIS — Z4682 Encounter for fitting and adjustment of non-vascular catheter: Secondary | ICD-10-CM | POA: Diagnosis not present

## 2017-01-17 DIAGNOSIS — R7302 Impaired glucose tolerance (oral): Secondary | ICD-10-CM | POA: Diagnosis not present

## 2017-01-17 DIAGNOSIS — Z452 Encounter for adjustment and management of vascular access device: Secondary | ICD-10-CM | POA: Diagnosis not present

## 2017-01-17 DIAGNOSIS — R739 Hyperglycemia, unspecified: Secondary | ICD-10-CM | POA: Diagnosis not present

## 2017-01-17 DIAGNOSIS — Z951 Presence of aortocoronary bypass graft: Secondary | ICD-10-CM | POA: Diagnosis not present

## 2017-01-17 DIAGNOSIS — J9811 Atelectasis: Secondary | ICD-10-CM | POA: Diagnosis not present

## 2017-01-17 DIAGNOSIS — J90 Pleural effusion, not elsewhere classified: Secondary | ICD-10-CM | POA: Diagnosis not present

## 2017-01-18 DIAGNOSIS — R7302 Impaired glucose tolerance (oral): Secondary | ICD-10-CM | POA: Diagnosis not present

## 2017-01-18 DIAGNOSIS — Z951 Presence of aortocoronary bypass graft: Secondary | ICD-10-CM | POA: Diagnosis not present

## 2017-01-18 DIAGNOSIS — R739 Hyperglycemia, unspecified: Secondary | ICD-10-CM | POA: Diagnosis not present

## 2017-01-18 DIAGNOSIS — J9811 Atelectasis: Secondary | ICD-10-CM | POA: Diagnosis not present

## 2017-01-18 DIAGNOSIS — R918 Other nonspecific abnormal finding of lung field: Secondary | ICD-10-CM | POA: Diagnosis not present

## 2017-01-18 DIAGNOSIS — J90 Pleural effusion, not elsewhere classified: Secondary | ICD-10-CM | POA: Diagnosis not present

## 2017-01-18 DIAGNOSIS — R0602 Shortness of breath: Secondary | ICD-10-CM | POA: Diagnosis not present

## 2017-01-19 DIAGNOSIS — R7302 Impaired glucose tolerance (oral): Secondary | ICD-10-CM | POA: Diagnosis not present

## 2017-01-19 DIAGNOSIS — Z951 Presence of aortocoronary bypass graft: Secondary | ICD-10-CM | POA: Diagnosis not present

## 2017-01-19 DIAGNOSIS — R739 Hyperglycemia, unspecified: Secondary | ICD-10-CM | POA: Diagnosis not present

## 2017-01-20 DIAGNOSIS — J90 Pleural effusion, not elsewhere classified: Secondary | ICD-10-CM | POA: Diagnosis not present

## 2017-01-20 DIAGNOSIS — Z951 Presence of aortocoronary bypass graft: Secondary | ICD-10-CM | POA: Diagnosis not present

## 2017-01-21 DIAGNOSIS — Z951 Presence of aortocoronary bypass graft: Secondary | ICD-10-CM | POA: Insufficient documentation

## 2017-02-03 DIAGNOSIS — Z951 Presence of aortocoronary bypass graft: Secondary | ICD-10-CM | POA: Diagnosis not present

## 2017-02-03 DIAGNOSIS — J90 Pleural effusion, not elsewhere classified: Secondary | ICD-10-CM | POA: Diagnosis not present

## 2017-02-03 DIAGNOSIS — R9431 Abnormal electrocardiogram [ECG] [EKG]: Secondary | ICD-10-CM | POA: Diagnosis not present

## 2017-02-03 DIAGNOSIS — Z48812 Encounter for surgical aftercare following surgery on the circulatory system: Secondary | ICD-10-CM | POA: Diagnosis not present

## 2017-02-06 ENCOUNTER — Ambulatory Visit: Payer: Self-pay | Admitting: Family Medicine

## 2017-02-09 ENCOUNTER — Encounter: Payer: Self-pay | Admitting: Family Medicine

## 2017-02-09 ENCOUNTER — Ambulatory Visit (INDEPENDENT_AMBULATORY_CARE_PROVIDER_SITE_OTHER): Payer: Federal, State, Local not specified - PPO

## 2017-02-09 ENCOUNTER — Ambulatory Visit (INDEPENDENT_AMBULATORY_CARE_PROVIDER_SITE_OTHER): Payer: Federal, State, Local not specified - PPO | Admitting: Family Medicine

## 2017-02-09 VITALS — BP 121/69 | HR 96 | Temp 98.8°F | Ht 64.0 in | Wt 148.0 lb

## 2017-02-09 DIAGNOSIS — I2510 Atherosclerotic heart disease of native coronary artery without angina pectoris: Secondary | ICD-10-CM | POA: Diagnosis not present

## 2017-02-09 DIAGNOSIS — R05 Cough: Secondary | ICD-10-CM | POA: Diagnosis not present

## 2017-02-09 DIAGNOSIS — G8929 Other chronic pain: Secondary | ICD-10-CM

## 2017-02-09 DIAGNOSIS — M25561 Pain in right knee: Secondary | ICD-10-CM | POA: Diagnosis not present

## 2017-02-09 DIAGNOSIS — Z9861 Coronary angioplasty status: Secondary | ICD-10-CM

## 2017-02-09 DIAGNOSIS — R7309 Other abnormal glucose: Secondary | ICD-10-CM | POA: Diagnosis not present

## 2017-02-09 DIAGNOSIS — R059 Cough, unspecified: Secondary | ICD-10-CM

## 2017-02-09 DIAGNOSIS — R740 Nonspecific elevation of levels of transaminase and lactic acid dehydrogenase [LDH]: Secondary | ICD-10-CM | POA: Diagnosis not present

## 2017-02-09 DIAGNOSIS — Z23 Encounter for immunization: Secondary | ICD-10-CM | POA: Diagnosis not present

## 2017-02-09 DIAGNOSIS — R7401 Elevation of levels of liver transaminase levels: Secondary | ICD-10-CM

## 2017-02-09 LAB — POCT GLYCOSYLATED HEMOGLOBIN (HGB A1C): Hemoglobin A1C: 5.7

## 2017-02-09 NOTE — Progress Notes (Signed)
Subjective:    Patient ID: Gregory Frost, male    DOB: 07/05/1960, 56 y.o.   MRN: [id number]  HPI 56 year old male is here today for follow-up for recent hospitalization for CABG.  He had a prior history of coronary artery disease and had been experiencing some vague chest symptoms in July.  He was seen by cardiology that month and they referred him for left heart catheterization July 30.  Cardiac cath showed severe disease with occluded proximal RCA and diagonal 2 where there was a previously placed stent.  There was moderate to severe disease in the distal circumflex.  At that time because he was not having persistent pain they just encouraged him to follow-up.  He then went home to visit in [place] and had a repeat catheterization where they had recommended bypass surgery.  He then had a consultation with [facility], Dr. Lynetta Mare in October.  That time he was having shortness of breath but no true anginal symptoms.  Recommended 5 vessel CABG at that time.  He had a CABG on January 16, 2017.  He is currently on statin therapy but did have a mild bump in his ALT and cardiology requested that we recheck that at his follow-up today.  Since surgery he has been waking up frequently at night but feels his mood is okay.  Sleep for a couple of hours and then wake up and then go back to sleep for a couple hours and then wake up again.  He is also had a persistent cough since surgery and was 4 weeks ago.  He feels like it is in the middle of his chest.  He denies any shortness of breath fever or other respiratory symptoms.  Review of Systems  BP 121/69   Pulse 96   Temp 98.8 F (37.1 C)   Ht 5\' 4"  (1.626 m)   Wt 148 lb (67.1 kg)   SpO2 99%   BMI 25.40 kg/m     Allergies  Allergen Reactions  . Lipitor [Atorvastatin] Other (See Comments)    Myalgias.     Past Medical History:  Diagnosis Date  . BACK PAIN W/RADIATION, UNSPECIFIED 12/20/2005   Qualifier: Diagnosis of  By: Thomos Lemons    . CAD in native artery 12/20/2005   Qualifier: Diagnosis of  By: Thomos Lemons    . GASTROESOPHAGEAL REFLUX, NO ESOPHAGITIS 12/20/2005   Qualifier: Diagnosis of  By: Thomos Lemons    . HYPERLIPIDEMIA 12/20/2005   Qualifier: Diagnosis of  By: Thomos Lemons    . MI (myocardial infarction) (HCC) 10/17/2005  . MYOCARDIAL INFARCTION, INITIAL EPISODE OF CARE 12/20/2005   Qualifier: Diagnosis of  By: Thomos Lemons      Past Surgical History:  Procedure Laterality Date  . APPENDECTOMY  2006  . LEFT HEART CATH AND CORONARY ANGIOGRAPHY N/A 10/10/2016   Procedure: Left Heart Cath and Coronary Angiography;  Surgeon: Marykay Lex, MD;  Location[facility] INVASIVE CV LAB;  Service: Cardiovascular;  Laterality: N/A;    Social History   Socioeconomic History  . Marital status: Married    Spouse name: Dr. Eden Lathe  . Number of children: 2  . Years of education: BA  . Highest education level: Not on file  Social Needs  . Financial resource strain: Not on file  . Food insecurity - worry: Not on file  . Food insecurity - inability: Not on file  . Transportation needs - medical: Not on file  .  Transportation needs - non-medical: Not on file  Occupational History  . Occupation: Engineer, building servicesperations Administrator    Comment: [employer]  Tobacco Use  . Smoking status: Former Smoker    Types: Cigarettes    Last attempt to quit: 03/14/1988    Years since quitting: 28.9  . Smokeless tobacco: Never Used  Substance and Sexual Activity  . Alcohol use: Yes    Alcohol/week: 2.4 oz    Types: 2 Glasses of wine, 2 Cans of beer per week  . Drug use: No  . Sexual activity: Yes    Partners: Female  Other Topics Concern  . Not on file  Social History Narrative   2 caffeinated drinks daily. Typically walks for about 20 minutes 5 days per week.    Family History  Problem Relation Age of Onset  . Heart attack Father   . Hyperlipidemia Father   . Hyperlipidemia Brother     Outpatient Encounter  Medications as of 02/09/2017  Medication Sig  . aspirin EC 325 MG tablet Take 325 mg by mouth daily.  . clopidogrel (PLAVIX) 75 MG tablet Take 1 tablet (75 mg total) by mouth daily.  Marland Kitchen. diltiazem (CARDIZEM SR) 90 MG 12 hr capsule Take 90 mg by mouth 2 (two) times daily.  Bess Harvest. Icosapent Ethyl (VASCEPA) 1 g CAPS Take 2 g by mouth 2 (two) times daily.  . metoprolol succinate (TOPROL XL) 25 MG 24 hr tablet Take 1 tablet (25 mg total) by mouth daily.  . Milk Thistle 500 MG CAPS Take 1 capsule by mouth daily.   . Multiple Vitamin (MULTIVITAMIN) capsule Take 1 capsule by mouth daily.  . nitroGLYCERIN (NITROSTAT) 0.4 MG SL tablet Place 1 tablet (0.4 mg total) under the tongue every 5 (five) minutes as needed for chest pain.  Marland Kitchen. Ubiquinol 100 MG CAPS Take 1 tablet by mouth daily.   . vitamin C (ASCORBIC ACID) 500 MG tablet Take 500 mg by mouth daily.  . [DISCONTINUED] potassium chloride SA (K-DUR,KLOR-CON) 20 MEQ tablet Take 20 mEq by mouth daily as needed.  . rosuvastatin (CRESTOR) 40 MG tablet Take 1 tablet (40 mg total) by mouth daily.  . [DISCONTINUED] Krill Oil 500 MG CAPS Take 500 mg by mouth daily.   . [DISCONTINUED] ramipril (ALTACE) 2.5 MG capsule Take 2.5 mg by mouth daily.  . [DISCONTINUED] ranitidine (ZANTAC) 150 MG capsule Take 150 mg by mouth daily.   No facility-administered encounter medications on file as of 02/09/2017.          Objective:   Physical Exam  Constitutional: He is oriented to person, place, and time. He appears well-developed and well-nourished.  HENT:  Head: Normocephalic and atraumatic.  Cardiovascular: Normal rate, regular rhythm and normal heart sounds.  Pulmonary/Chest: Effort normal and breath sounds normal.  Musculoskeletal:  Right knee with some bruising medially from it is tender along the lateral joint line put.  Nontender around the patella.  Some clicking with flexion and extension.  No increased laxity with anterior drawer.  Negative McMurray's.  Strength  is 5 out of 5.  Neurological: He is alert and oriented to person, place, and time.  Skin: Skin is warm and dry.  Sternal incision over mid chest is healing very well.  Just a little bit of scabbing in place.  No drainage or erythema.  Clean dry and intact.  Psychiatric: He has a normal mood and affect. His behavior is normal.          Assessment & Plan:  Status post CABG x5 vessels -still recovering and healing from surgery.  Still has a lot of soreness in his chest but incision looks great and is healing well.  In fact he can go ahead and start using a scar cream.  Medications were updated on his chart.  He is now off of the Plavix.  He is on Crestor.  Elevated ALT-and to recheck hepatic function.  Elevated glucose-hemoglobin A1c 5.7 in the prediabetic range.  Continue to work on cutting out sweets and carbs.  Recheck in 90 days.  Cough times 3-1/2 weeks-status post intubation.  Lungs are clear on exam but symptoms are persistent.  He says it is better than it was right after surgery.  Discussed with him the option of giving this just a little bit longer but if not improving to go ahead and try a course of azithromycin.  Pneumo 23 given today.

## 2017-02-09 NOTE — Addendum Note (Signed)
Addended by: Deno EtienneBARKLEY, Jaquane Boughner L on: 02/09/2017 12:06 PM   Modules accepted: Orders

## 2017-02-21 ENCOUNTER — Encounter: Payer: Self-pay | Admitting: Family Medicine

## 2017-02-21 MED ORDER — DILTIAZEM HCL ER 180 MG PO CP24: 180.0000 mg | ORAL_CAPSULE | Freq: Every day | ORAL | 1 refills | Status: DC

## 2017-02-21 NOTE — Telephone Encounter (Signed)
Spoke with pharmacy. They do not have the 90mg  12 hr capsule, but they do have a 180mg  24 hr capsule. That is the currently daily dose. Will route.

## 2017-03-21 DIAGNOSIS — I252 Old myocardial infarction: Secondary | ICD-10-CM | POA: Diagnosis not present

## 2017-03-21 DIAGNOSIS — I2510 Atherosclerotic heart disease of native coronary artery without angina pectoris: Secondary | ICD-10-CM | POA: Diagnosis not present

## 2017-03-21 DIAGNOSIS — Z951 Presence of aortocoronary bypass graft: Secondary | ICD-10-CM | POA: Diagnosis not present

## 2017-03-21 DIAGNOSIS — E785 Hyperlipidemia, unspecified: Secondary | ICD-10-CM | POA: Diagnosis not present

## 2017-03-22 DIAGNOSIS — K080 Exfoliation of teeth due to systemic causes: Secondary | ICD-10-CM | POA: Diagnosis not present

## 2017-05-10 DIAGNOSIS — R740 Nonspecific elevation of levels of transaminase and lactic acid dehydrogenase [LDH]: Secondary | ICD-10-CM | POA: Diagnosis not present

## 2017-05-10 LAB — HEPATIC FUNCTION PANEL
AG RATIO: 1.7 (calc) (ref 1.0–2.5)
ALBUMIN MSPROF: 4.5 g/dL (ref 3.6–5.1)
ALKALINE PHOSPHATASE (APISO): 59 U/L (ref 40–115)
ALT: 35 U/L (ref 9–46)
AST: 21 U/L (ref 10–35)
Bilirubin, Direct: 0.1 mg/dL (ref 0.0–0.2)
Globulin: 2.7 g/dL (calc) (ref 1.9–3.7)
Indirect Bilirubin: 0.2 mg/dL (calc) (ref 0.2–1.2)
TOTAL PROTEIN: 7.2 g/dL (ref 6.1–8.1)
Total Bilirubin: 0.3 mg/dL (ref 0.2–1.2)

## 2017-05-11 ENCOUNTER — Encounter: Payer: Self-pay | Admitting: Family Medicine

## 2017-05-11 ENCOUNTER — Ambulatory Visit: Payer: Federal, State, Local not specified - PPO | Admitting: Family Medicine

## 2017-05-11 VITALS — BP 120/73 | HR 77 | Ht 64.0 in | Wt 157.0 lb

## 2017-05-11 DIAGNOSIS — I2510 Atherosclerotic heart disease of native coronary artery without angina pectoris: Secondary | ICD-10-CM | POA: Diagnosis not present

## 2017-05-11 DIAGNOSIS — Z9861 Coronary angioplasty status: Secondary | ICD-10-CM | POA: Diagnosis not present

## 2017-05-11 DIAGNOSIS — R7301 Impaired fasting glucose: Secondary | ICD-10-CM

## 2017-05-11 DIAGNOSIS — L910 Hypertrophic scar: Secondary | ICD-10-CM | POA: Diagnosis not present

## 2017-05-11 DIAGNOSIS — R945 Abnormal results of liver function studies: Secondary | ICD-10-CM

## 2017-05-11 DIAGNOSIS — K7689 Other specified diseases of liver: Secondary | ICD-10-CM

## 2017-05-11 LAB — POCT GLYCOSYLATED HEMOGLOBIN (HGB A1C): Hemoglobin A1C: 5.8

## 2017-05-11 NOTE — Progress Notes (Signed)
Subjective:    CC: glucose and liver   HPI:  History of elevated liver enzymes-he just went for his lab work yesterday and repeat enzymes were in the normal range.  Abnormal glucose-last hemoglobin A1c was 5.7 in November.  He reports that overall he eats a very healthy diet.  But does tend to eat higher carb at lunch time.  He also wanted to discuss his scar.  He is been using various things for postsurgical scars and nothing has really seemed to help.  He says when he moves he feels almost like it is pulling like it is tight.  He would have had any recommendations.  Status post CABG-he says he is active and walks a lot at work but does not have a specific exercise or workout routine.  Past medical history, Surgical history, Family history not pertinant except as noted below, Social history, Allergies, and medications have been entered into the medical record, reviewed, and corrections made.   Review of Systems: No fevers, chills, night sweats, weight loss, chest pain, or shortness of breath.   Objective:    General: Well Developed, well nourished, and in no acute distress.  Neuro: Alert and oriented x3, extra-ocular muscles intact, sensation grossly intact.  HEENT: Normocephalic, atraumatic  Skin: Warm and dry, no rashes.  The scar over the center of his chest from his bypass surgery has healed well overall.  The upper quarter of the scar is nice and flat.  The bottom three quarters of the scar has hypertrophied.  No keloid. Cardiac: Regular rate and rhythm, no murmurs rubs or gallops, no lower extremity edema.  Respiratory: Clear to auscultation bilaterally. Not using accessory muscles, speaking in full sentences.   Impression and Recommendations:    IFG -hemoglobin A1c 5.8 today.  Up just slightly from previous.  Just encouraged him to be consistent with exercise.  Discussed eating a low-carb diet.  Elevated liver enzymes - normal repeat labs.    Scar treatment-we discussed  several options.  For now he wants to try the silicone sheets.  He would need to shave his chest for these to be effective.  If not improving then we can consider cryotherapy or even Aldara.  We also discussed intralesional steroids but with his largest his scar is I do not think that this would be the best option at this time.  Status post CABG-he has followed up with cardiology.  He says he does not need any refills that he knows of today.

## 2017-05-11 NOTE — Patient Instructions (Addendum)
   You can try the silicone sheets.  If not helping we can try freezing it.

## 2017-08-16 ENCOUNTER — Other Ambulatory Visit: Payer: Self-pay | Admitting: Family Medicine

## 2017-09-02 IMAGING — NM NM MISC PROCEDURE
9 series · 54 of 54 positions shown · non-contrast
Comparison: none

[Series 1: wbr_r-proj_st wbr rest · 6.40mm/px · 6 of 64 frames shown]
[frame 6/64]
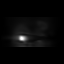
[frame 16/64]
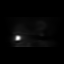
[frame 27/64]
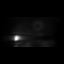
[frame 38/64]
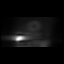
[frame 48/64]
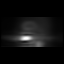
[frame 59/64]
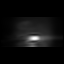

[Series 1: wbr rest · 6.40mm/px · 6 of 64 frames shown]
[frame 6/64]
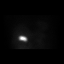
[frame 16/64]
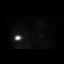
[frame 27/64]
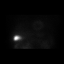
[frame 38/64]
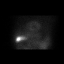
[frame 48/64]
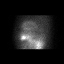
[frame 59/64]
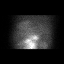

[Series 1: rest sax · 6.4mm · 6.40mm/px · 6 of 25 frames shown]
[frame 3/25]
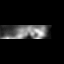
[frame 7/25]
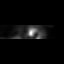
[frame 11/25]
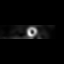
[frame 15/25]
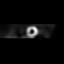
[frame 19/25]
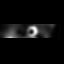
[frame 23/25]
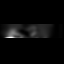

[Series 2: wbr stress-gsp · 6.40mm/px · 6 of 512 frames shown]
[frame 43/512]
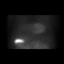
[frame 128/512]
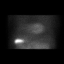
[frame 214/512]
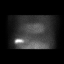
[frame 299/512]
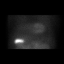
[frame 384/512]
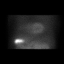
[frame 470/512]
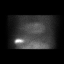

[Series 2: stress sax · 6.4mm · 6.40mm/px · 6 of 27 frames shown]
[frame 3/27]
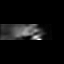
[frame 7/27]
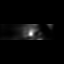
[frame 12/27]
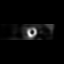
[frame 16/27]
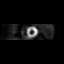
[frame 21/27]
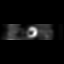
[frame 25/27]
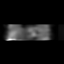

[Series 2: wbr_s-proj_st wbr stress-gsp · 6.40mm/px · 6 of 512 frames shown]
[frame 43/512]
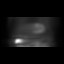
[frame 128/512]
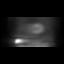
[frame 214/512]
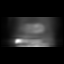
[frame 299/512]
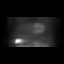
[frame 384/512]
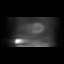
[frame 470/512]
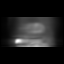

[Series 2: stress sax gs · 6.4mm · 6.40mm/px · 6 of 216 frames shown]
[frame 19/216]
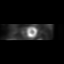
[frame 55/216]
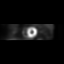
[frame 91/216]
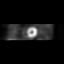
[frame 127/216]
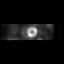
[frame 163/216]
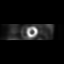
[frame 199/216]
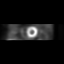

[Series 3: wbr stress-sum-em · 6.40mm/px · 6 of 64 frames shown]
[frame 6/64]
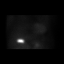
[frame 16/64]
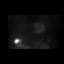
[frame 27/64]
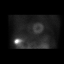
[frame 38/64]
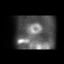
[frame 48/64]
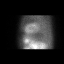
[frame 59/64]
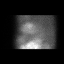

[Series 3: wbr_s-proj_st wbr stress-sum-em · 6.40mm/px · 6 of 64 frames shown]
[frame 6/64]
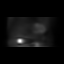
[frame 16/64]
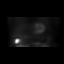
[frame 27/64]
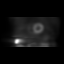
[frame 38/64]
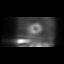
[frame 48/64]
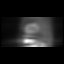
[frame 59/64]
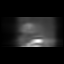

[54 of 54 positions shown; findings below may reference images not displayed]

Canned report from images found in remote index.

Refer to host system for actual result text.

## 2017-09-21 ENCOUNTER — Other Ambulatory Visit: Payer: Self-pay | Admitting: Cardiology

## 2017-09-21 DIAGNOSIS — I2510 Atherosclerotic heart disease of native coronary artery without angina pectoris: Secondary | ICD-10-CM

## 2017-09-25 DIAGNOSIS — K080 Exfoliation of teeth due to systemic causes: Secondary | ICD-10-CM | POA: Diagnosis not present

## 2017-10-24 ENCOUNTER — Encounter: Payer: Self-pay | Admitting: Family Medicine

## 2017-10-24 DIAGNOSIS — R740 Nonspecific elevation of levels of transaminase and lactic acid dehydrogenase [LDH]: Secondary | ICD-10-CM

## 2017-10-24 DIAGNOSIS — E782 Mixed hyperlipidemia: Secondary | ICD-10-CM

## 2017-10-24 DIAGNOSIS — R945 Abnormal results of liver function studies: Secondary | ICD-10-CM

## 2017-10-24 DIAGNOSIS — R7301 Impaired fasting glucose: Secondary | ICD-10-CM

## 2017-10-24 DIAGNOSIS — R7309 Other abnormal glucose: Secondary | ICD-10-CM

## 2017-10-24 DIAGNOSIS — R7401 Elevation of levels of liver transaminase levels: Secondary | ICD-10-CM

## 2017-10-24 DIAGNOSIS — E785 Hyperlipidemia, unspecified: Secondary | ICD-10-CM

## 2017-10-31 DIAGNOSIS — E782 Mixed hyperlipidemia: Secondary | ICD-10-CM | POA: Diagnosis not present

## 2017-10-31 DIAGNOSIS — E785 Hyperlipidemia, unspecified: Secondary | ICD-10-CM | POA: Diagnosis not present

## 2017-10-31 DIAGNOSIS — R7301 Impaired fasting glucose: Secondary | ICD-10-CM | POA: Diagnosis not present

## 2017-10-31 DIAGNOSIS — R740 Nonspecific elevation of levels of transaminase and lactic acid dehydrogenase [LDH]: Secondary | ICD-10-CM | POA: Diagnosis not present

## 2017-11-02 DIAGNOSIS — K080 Exfoliation of teeth due to systemic causes: Secondary | ICD-10-CM | POA: Diagnosis not present

## 2017-11-07 ENCOUNTER — Ambulatory Visit (INDEPENDENT_AMBULATORY_CARE_PROVIDER_SITE_OTHER): Payer: Federal, State, Local not specified - PPO | Admitting: Family Medicine

## 2017-11-07 ENCOUNTER — Encounter: Payer: Self-pay | Admitting: Family Medicine

## 2017-11-07 VITALS — BP 106/63 | HR 83 | Ht 64.0 in | Wt 159.0 lb

## 2017-11-07 DIAGNOSIS — Z9861 Coronary angioplasty status: Secondary | ICD-10-CM | POA: Diagnosis not present

## 2017-11-07 DIAGNOSIS — I2510 Atherosclerotic heart disease of native coronary artery without angina pectoris: Secondary | ICD-10-CM

## 2017-11-07 DIAGNOSIS — Z23 Encounter for immunization: Secondary | ICD-10-CM | POA: Diagnosis not present

## 2017-11-07 DIAGNOSIS — R7301 Impaired fasting glucose: Secondary | ICD-10-CM

## 2017-11-07 LAB — POCT GLYCOSYLATED HEMOGLOBIN (HGB A1C): HEMOGLOBIN A1C: 5.7 % — AB (ref 4.0–5.6)

## 2017-11-07 MED ORDER — ROSUVASTATIN CALCIUM 40 MG PO TABS: 40.0000 mg | ORAL_TABLET | Freq: Every day | ORAL | 3 refills | Status: DC

## 2017-11-07 MED ORDER — METOPROLOL SUCCINATE ER 25 MG PO TB24: 25.0000 mg | ORAL_TABLET | Freq: Every day | ORAL | 3 refills | Status: DC

## 2017-11-07 NOTE — Progress Notes (Signed)
Subjective:    CC:   HPI: Impaired fasting glucose-no increased thirst or urination. No symptoms consistent with hypoglycemia.  Trying to really cut back on his carb intake and eats mostly protein and salads.  He has a few vegetables he likes but not a lot of them.  CAD-some occasional discomfort in his chest wall but does not think it is cardiac related.  He follows with cardiology once a year.  He takes his medication regularly and is [facility]  He tries to get a certain number of steps in.  Did go for his lab recently but says he never received a call back.  They never came across into our system.  Past medical history, Surgical history, Family history not pertinant except as noted below, Social history, Allergies, and medications have been entered into the medical record, reviewed, and corrections made.   Review of Systems: No fevers, chills, night sweats, weight loss, chest pain, or shortness of breath.   Objective:    General: Well Developed, well nourished, and in no acute distress.  Neuro: Alert and oriented x3, extra-ocular muscles intact, sensation grossly intact.  HEENT: Normocephalic, atraumatic  Skin: Warm and dry, no rashes. Cardiac: Regular rate and rhythm, no murmurs rubs or gallops, no lower extremity edema.  Respiratory: Clear to auscultation bilaterally. Not using accessory muscles, speaking in full sentences.   Impression and Recommendations:    IFG -controlled in fact hemoglobin A1c down to 5.7 which is fantastic.  Continue to monitor.  Plan to recheck again in 6 months.  CAD -stable.  Continue current regimen.  Refills sent for metoprolol and Crestor.  His LDL was less than 70 which is optimal.  Her glycerides are still little elevated but he is taking his Vascepa.

## 2017-11-08 LAB — CBC
HCT: 43.4 % (ref 38.5–50.0)
HEMOGLOBIN: 14.9 g/dL (ref 13.2–17.1)
MCH: 31 pg (ref 27.0–33.0)
MCHC: 34.3 g/dL (ref 32.0–36.0)
MCV: 90.2 fL (ref 80.0–100.0)
MPV: 11.7 fL (ref 7.5–12.5)
PLATELETS: 275 10*3/uL (ref 140–400)
RBC: 4.81 10*6/uL (ref 4.20–5.80)
RDW: 12.3 % (ref 11.0–15.0)
WBC: 6.1 10*3/uL (ref 3.8–10.8)

## 2017-11-08 LAB — COMPLETE METABOLIC PANEL WITH GFR
AG RATIO: 1.7 (calc) (ref 1.0–2.5)
ALBUMIN MSPROF: 4.6 g/dL (ref 3.6–5.1)
ALT: 33 U/L (ref 9–46)
AST: 23 U/L (ref 10–35)
Alkaline phosphatase (APISO): 56 U/L (ref 40–115)
BILIRUBIN TOTAL: 0.5 mg/dL (ref 0.2–1.2)
BUN: 18 mg/dL (ref 7–25)
CALCIUM: 9.5 mg/dL (ref 8.6–10.3)
CHLORIDE: 104 mmol/L (ref 98–110)
CO2: 27 mmol/L (ref 20–32)
Creat: 1.13 mg/dL (ref 0.70–1.33)
GFR, EST AFRICAN AMERICAN: 84 mL/min/{1.73_m2} (ref >=60)
GFR, EST NON AFRICAN AMERICAN: 73 mL/min/{1.73_m2} (ref >=60)
Globulin: 2.7 g/dL (calc) (ref 1.9–3.7)
Glucose, Bld: 98 mg/dL (ref 65–99)
POTASSIUM: 4.7 mmol/L (ref 3.5–5.3)
Sodium: 141 mmol/L (ref 135–146)
TOTAL PROTEIN: 7.3 g/dL (ref 6.1–8.1)

## 2017-11-08 LAB — LIPID PANEL
Cholesterol: 122 mg/dL (ref <200)
HDL: 36 mg/dL — ABNORMAL LOW (ref >40)
LDL CHOLESTEROL (CALC): 59 mg/dL
NON-HDL CHOLESTEROL (CALC): 86 mg/dL (ref <130)
Total CHOL/HDL Ratio: 3.4 (calc) (ref <5.0)
Triglycerides: 209 mg/dL — ABNORMAL HIGH (ref <150)

## 2017-11-08 LAB — PSA: PSA: 1.4 ng/mL (ref <=4.0)

## 2018-01-15 ENCOUNTER — Other Ambulatory Visit: Payer: Self-pay | Admitting: *Deleted

## 2018-01-15 DIAGNOSIS — E782 Mixed hyperlipidemia: Secondary | ICD-10-CM

## 2018-01-15 MED ORDER — ICOSAPENT ETHYL 1 G PO CAPS: 2.0000 g | ORAL_CAPSULE | Freq: Two times a day (BID) | ORAL | 99 refills | Status: DC

## 2018-02-07 ENCOUNTER — Other Ambulatory Visit: Payer: Self-pay | Admitting: Family Medicine

## 2018-02-27 ENCOUNTER — Other Ambulatory Visit: Payer: Self-pay | Admitting: Family Medicine

## 2018-03-26 DIAGNOSIS — I252 Old myocardial infarction: Secondary | ICD-10-CM | POA: Diagnosis not present

## 2018-03-26 DIAGNOSIS — Z951 Presence of aortocoronary bypass graft: Secondary | ICD-10-CM | POA: Diagnosis not present

## 2018-03-28 DIAGNOSIS — I519 Heart disease, unspecified: Secondary | ICD-10-CM | POA: Diagnosis not present

## 2018-03-28 DIAGNOSIS — Z951 Presence of aortocoronary bypass graft: Secondary | ICD-10-CM | POA: Diagnosis not present

## 2018-03-28 DIAGNOSIS — I517 Cardiomegaly: Secondary | ICD-10-CM | POA: Diagnosis not present

## 2018-05-10 ENCOUNTER — Ambulatory Visit: Payer: Federal, State, Local not specified - PPO | Admitting: Family Medicine

## 2018-05-10 ENCOUNTER — Encounter: Payer: Self-pay | Admitting: Family Medicine

## 2018-05-10 VITALS — BP 103/55 | HR 70 | Ht 64.0 in | Wt 160.0 lb

## 2018-05-10 DIAGNOSIS — R7301 Impaired fasting glucose: Secondary | ICD-10-CM | POA: Diagnosis not present

## 2018-05-10 DIAGNOSIS — R0989 Other specified symptoms and signs involving the circulatory and respiratory systems: Secondary | ICD-10-CM | POA: Diagnosis not present

## 2018-05-10 DIAGNOSIS — R09A2 Foreign body sensation, throat: Secondary | ICD-10-CM

## 2018-05-10 DIAGNOSIS — R6889 Other general symptoms and signs: Secondary | ICD-10-CM

## 2018-05-10 DIAGNOSIS — I2510 Atherosclerotic heart disease of native coronary artery without angina pectoris: Secondary | ICD-10-CM | POA: Diagnosis not present

## 2018-05-10 DIAGNOSIS — Z9861 Coronary angioplasty status: Secondary | ICD-10-CM

## 2018-05-10 DIAGNOSIS — R198 Other specified symptoms and signs involving the digestive system and abdomen: Secondary | ICD-10-CM

## 2018-05-10 DIAGNOSIS — R0683 Snoring: Secondary | ICD-10-CM

## 2018-05-10 LAB — POCT GLYCOSYLATED HEMOGLOBIN (HGB A1C): Hemoglobin A1C: 6 % — AB (ref 4.0–5.6)

## 2018-05-10 NOTE — Progress Notes (Signed)
Subjective:    CC: IFG   HPI:  Impaired fasting glucose-no increased thirst or urination. No symptoms consistent with hypoglycemia. Hasn't been exercising as regularly bc of the cold weather.    Patient complains of a persistently irritated throat.  He says is not really painful it just feels like something is stuck in the back of his throat.  And sometimes it makes him feel like he has to clear his throat or cough.  He feels like there is extra fluid or maybe drainage since his CABG surgery in November 2018.  He has a history of reflux but has not had any problems with GERD etc. more recently.  F/U CAD - STable. No recent chest pain or SOB.    He also c/o snoring.  STOP BANG score in 2018 was Posititve but he never went for study as he had CABG surgery that year.  He says he wakes up multiple times a night in fact most of the time about 10 times per night.  Sometimes it is because of his snoring.  Reports that his wife snores as well.   Past medical history, Surgical history, Family history not pertinant except as noted below, Social history, Allergies, and medications have been entered into the medical record, reviewed, and corrections made.   Review of Systems: No fevers, chills, night sweats, weight loss, chest pain, or shortness of breath.   Objective:    General: Well Developed, well nourished, and in no acute distress.  Neuro: Alert and oriented x3, extra-ocular muscles intact, sensation grossly intact.  HEENT: Normocephalic, atraumatic  Skin: Warm and dry, no rashes. Cardiac: Regular rate and rhythm, no murmurs rubs or gallops, no lower extremity edema.  Respiratory: Clear to auscultation bilaterally. Not using accessory muscles, speaking in full sentences.   Impression and Recommendations:   IFG -  A1C up from previous at 6.0.  Work on diet and exercise. Discussed metformin as a option. F/U in 6 months.    CAD - Need up to date labs.  Stable.    Snoring - will order home  sleep study.  STOP BANG Score of 5 today.  Gust options including home study versus sleep lab study.  He would prefer to do home study.  We will go ahead and place order today.  Globus senstation with throat clearing and cough. Will refer to ENT.  I think he might benefit from a scope to see if there is any evidence of maybe silent reflux causing irritation of the vocal cords and just to rule out mass polyp etc.

## 2018-05-23 DIAGNOSIS — Z9861 Coronary angioplasty status: Secondary | ICD-10-CM | POA: Diagnosis not present

## 2018-05-23 DIAGNOSIS — I2510 Atherosclerotic heart disease of native coronary artery without angina pectoris: Secondary | ICD-10-CM | POA: Diagnosis not present

## 2018-05-24 LAB — COMPLETE METABOLIC PANEL WITH GFR
AG Ratio: 1.8 (calc) (ref 1.0–2.5)
ALBUMIN MSPROF: 4.5 g/dL (ref 3.6–5.1)
ALT: 28 U/L (ref 9–46)
AST: 18 U/L (ref 10–35)
Alkaline phosphatase (APISO): 56 U/L (ref 35–144)
BUN: 19 mg/dL (ref 7–25)
CO2: 27 mmol/L (ref 20–32)
CREATININE: 0.96 mg/dL (ref 0.70–1.33)
Calcium: 9.6 mg/dL (ref 8.6–10.3)
Chloride: 105 mmol/L (ref 98–110)
GFR, EST NON AFRICAN AMERICAN: 88 mL/min/{1.73_m2} (ref >=60)
GFR, Est African American: 102 mL/min/{1.73_m2} (ref >=60)
GLOBULIN: 2.5 g/dL (ref 1.9–3.7)
Glucose, Bld: 98 mg/dL (ref 65–99)
Potassium: 4.6 mmol/L (ref 3.5–5.3)
SODIUM: 141 mmol/L (ref 135–146)
Total Bilirubin: 0.4 mg/dL (ref 0.2–1.2)
Total Protein: 7 g/dL (ref 6.1–8.1)

## 2018-05-24 LAB — LIPID PANEL
Cholesterol: 131 mg/dL (ref <200)
HDL: 37 mg/dL — AB (ref >=40)
LDL Cholesterol (Calc): 68 mg/dL (calc)
NON-HDL CHOLESTEROL (CALC): 94 mg/dL (ref <130)
Total CHOL/HDL Ratio: 3.5 (calc) (ref <5.0)
Triglycerides: 182 mg/dL — ABNORMAL HIGH (ref <150)

## 2018-06-13 ENCOUNTER — Encounter (HOSPITAL_BASED_OUTPATIENT_CLINIC_OR_DEPARTMENT_OTHER): Payer: Federal, State, Local not specified - PPO

## 2018-07-25 ENCOUNTER — Ambulatory Visit (HOSPITAL_BASED_OUTPATIENT_CLINIC_OR_DEPARTMENT_OTHER): Payer: Federal, State, Local not specified - PPO | Attending: Family Medicine | Admitting: Internal Medicine

## 2018-07-25 ENCOUNTER — Other Ambulatory Visit: Payer: Self-pay

## 2018-07-25 VITALS — Ht 64.0 in | Wt 158.0 lb

## 2018-07-25 DIAGNOSIS — G4733 Obstructive sleep apnea (adult) (pediatric): Secondary | ICD-10-CM | POA: Diagnosis not present

## 2018-07-25 DIAGNOSIS — R0683 Snoring: Secondary | ICD-10-CM | POA: Diagnosis not present

## 2018-07-31 ENCOUNTER — Other Ambulatory Visit: Payer: Self-pay | Admitting: Family Medicine

## 2018-08-03 DIAGNOSIS — R0683 Snoring: Secondary | ICD-10-CM

## 2018-08-03 NOTE — Procedures (Signed)
   Patient Name: Gregory Frost, Maheu Date: 07/25/2018 Gender: Male D.O.B: 1960-05-20 Age (years): 57 Referring Provider: Nani Gasser Height (inches): 64 Interpreting Physician: Jetty Duhamel MD, ABSM Weight (lbs): 158 RPSGT: Masury Sink BMI: 27 MRN: [id number] Neck Size: 16.50  CLINICAL INFORMATION Sleep Study Type: HST Indication for sleep study: Snoring Epworth Sleepiness Score: 11  SLEEP STUDY TECHNIQUE A multi-channel overnight portable sleep study was performed. The channels recorded were: nasal airflow, thoracic respiratory movement, and oxygen saturation with a pulse oximetry. Snoring was also monitored.  MEDICATIONS Patient self administered medications include: none reported.  SLEEP ARCHITECTURE Patient was studied for 541.1 minutes. The sleep efficiency was 100.0 % and the patient was supine for 64.9%. The arousal index was 0.0 per hour.  RESPIRATORY PARAMETERS The overall AHI was 46.4 per hour, with a central apnea index of 0.1 per hour. The oxygen nadir was 77% during sleep.  CARDIAC DATA Mean heart rate during sleep was 67.1 bpm.  IMPRESSIONS - Severe obstructive sleep apnea occurred during this study (AHI = 46.4/h). - No significant central sleep apnea occurred during this study (CAI = 0.1/h). - Severe oxygen desaturation was noted during this study (Min O2 = 77%). Mean sat 94%. - Patient snored   DIAGNOSIS - Obstructive Sleep Apnea (327.23 [G47.33 ICD-10])  RECOMMENDATIONS - Suggest CPAP titration sleep study or autopap. Other options might include a fitted oral appliance, Sleep medicine consultation or ENT evaluation, based on clinical judgment. - Be careful with alcohol, sedatives and other CNS depressants that may worsen sleep apnea and disrupt normal sleep architecture. - Sleep hygiene should be reviewed to assess factors that may improve sleep quality. - Weight management and regular exercise should be initiated or continued.   [Electronically signed] 08/03/2018 02:20 PM  Jetty Duhamel MD, ABSM Diplomate, American Board of Sleep Medicine   NPI: [id number]                          Jetty Duhamel Diplomate, American Board of Sleep Medicine  ELECTRONICALLY SIGNED ON:  08/03/2018, 2:16 PM [facility] PH: [phone]   FX: [phone] ACCREDITED BY THE AMERICAN ACADEMY OF SLEEP MEDICINE

## 2018-08-07 ENCOUNTER — Ambulatory Visit (INDEPENDENT_AMBULATORY_CARE_PROVIDER_SITE_OTHER): Payer: Federal, State, Local not specified - PPO | Admitting: Family Medicine

## 2018-08-07 ENCOUNTER — Encounter: Payer: Self-pay | Admitting: Family Medicine

## 2018-08-07 VITALS — BP 139/94 | HR 86 | Ht 64.0 in | Wt 158.0 lb

## 2018-08-07 DIAGNOSIS — G4733 Obstructive sleep apnea (adult) (pediatric): Secondary | ICD-10-CM | POA: Diagnosis not present

## 2018-08-07 MED ORDER — AMBULATORY NON FORMULARY MEDICATION: 0 refills | Status: DC

## 2018-08-07 NOTE — Assessment & Plan Note (Signed)
AHI of 46 on HSS on 07/2018.

## 2018-08-07 NOTE — Progress Notes (Signed)
Virtual Visit via Telephone Note  I connected with Gregory Frost on 08/07/18 at  1:40 PM EDT by telephone and verified that I am speaking with the correct person using two identifiers.   I discussed the limitations, risks, security and privacy concerns of performing an evaluation and management service by telephone and the availability of in person appointments. I also discussed with the patient that there may be a patient responsible charge related to this service. The patient expressed understanding and agreed to proceed.  Pt was at home and I was in my office for the virtual visit.     Subjective:    CC: Follow-up sleep apnea testing.  HPI: 58 year old male with a history of coronary artery disease status post CABG came in complaining in February about some loud snoring.  His stop bang score in 2018 was 5, which was positive.  But he never went for the sleep study at that time.  He complained that he was waking up multiple times at night sometimes because of his snoring.  Though he reported his wife snores as well.  He also complained of a constantly irritated throat.  He actually ended up having a home sleep study on May 13 which was read by Dr. Jetty Duhamel 1 of our pulmonologist.  Did not take any medications before the study.  The AHI it was approximately 46.4/h with a central apnea index of 0.1/h.  Oxygen nadir was 77% during sleep.  Recommendation was for CPAP titration to avoid sedatives that might worsen sleep apnea.   Past medical history, Surgical history, Family history not pertinant except as noted below, Social history, Allergies, and medications have been entered into the medical record, reviewed, and corrections made.   Review of Systems: No fevers, chills, night sweats, weight loss, chest pain, or shortness of breath.   Objective:    General: Speaking clearly in complete sentences without any shortness of breath.  Alert and oriented x3.  Normal judgment. No apparent  acute distress.    Impression and Recommendations:    OSA - new diagnosis - discussed results and treatment options.  AHI of 46, consistant with severe OSA. Discussed CPAP autopap titration. Plan to order DME through Home Health and schedule for download in 2 weeeks and then will set CPAP for 2 weeks and then f/u.  Avoid sedatives.     I discussed the assessment and treatment plan with the patient. The patient was provided an opportunity to ask questions and all were answered. The patient agreed with the plan and demonstrated an understanding of the instructions.   The patient was advised to call back or seek an in-person evaluation if the symptoms worsen or if the condition fails to improve as anticipated.  I provided 22 minutes of non-face-to-face time during this encounter.   Nani Gasser, MD

## 2018-08-10 ENCOUNTER — Other Ambulatory Visit: Payer: Self-pay

## 2018-08-10 DIAGNOSIS — G4733 Obstructive sleep apnea (adult) (pediatric): Secondary | ICD-10-CM

## 2018-08-10 MED ORDER — AMBULATORY NON FORMULARY MEDICATION: 0 refills | Status: DC

## 2018-08-16 DIAGNOSIS — G4733 Obstructive sleep apnea (adult) (pediatric): Secondary | ICD-10-CM | POA: Diagnosis not present

## 2018-08-23 ENCOUNTER — Encounter: Payer: Self-pay | Admitting: Family Medicine

## 2018-08-23 NOTE — Telephone Encounter (Signed)
I called [facility] and they do not have a specific form that needs to be sent. I also tried to explain about the download and per Jeani Hawking she would not give me time to give her the patient information to get the results.   I then asked for a form and Jeani Hawking stated that you would need to send in a new prescription to [facility] with the new pressure adjustment and diagnoses code to [phone]. Please advise.

## 2018-08-23 NOTE — Telephone Encounter (Signed)
I am glad he called.  He is a new start on CPAP so he is currently on AutoPap so it is going up and down all night long.  [facility] was supposed to fax me a download off of his machine 2 weeks after he started so clearly I never got that report.  They need to fax it to [place] so that I can set his pressure so that is not going up and down all night and it should be much more comfortable.  Please let him know that we will contact them for the download report and that we never received it.  I do not want to him to think that we were not doing our job here.

## 2018-08-24 NOTE — Telephone Encounter (Signed)
Faxed request for download to be sent to [place] to [phone] with confirmation received.

## 2018-08-24 NOTE — Telephone Encounter (Signed)
Can you please call [facility] and have them fax over the download

## 2018-09-04 ENCOUNTER — Encounter: Payer: Self-pay | Admitting: Family Medicine

## 2018-09-04 DIAGNOSIS — G4733 Obstructive sleep apnea (adult) (pediatric): Secondary | ICD-10-CM

## 2018-09-10 MED ORDER — AMBULATORY NON FORMULARY MEDICATION: 0 refills | Status: DC

## 2018-09-10 NOTE — Telephone Encounter (Signed)
We requested the download the first time and never received it.  If we don't receive it today then we need to call them gain in the AM.

## 2018-09-10 NOTE — Telephone Encounter (Signed)
Gregory Frost called and states he is very uncomfortable with the CPAP. He states there is too much pressure. I spoke with [facility] and they are faxing over his download. The only other option would be to send him back for a sleep study to switch him to a BI-PAP.

## 2018-09-10 NOTE — Telephone Encounter (Signed)
Patient advised. Will fax form after it has been signed.

## 2018-09-10 NOTE — Telephone Encounter (Signed)
Please tell him I apologize for the delay.  On the original order for the CPAP we had requested a download after 2 weeks.  Then when he called back a couple of weeks ago we also requested a download.  We called again and finally received it today so I did fax over a new order this afternoon to set his pressure to 13 cm of water pressure.  This is based on his AHI on the download.  If after a week or 2 he feels like the pressure is still a little too high then please let us know and we will adjust it slightly downward until it is a little bit more comfortable.  And then we will try to download them again after 2 weeks to see if it is working well.

## 2018-09-15 DIAGNOSIS — G4733 Obstructive sleep apnea (adult) (pediatric): Secondary | ICD-10-CM | POA: Diagnosis not present

## 2018-10-16 DIAGNOSIS — G4733 Obstructive sleep apnea (adult) (pediatric): Secondary | ICD-10-CM | POA: Diagnosis not present

## 2018-11-08 ENCOUNTER — Encounter: Payer: Self-pay | Admitting: Family Medicine

## 2018-11-08 ENCOUNTER — Ambulatory Visit (INDEPENDENT_AMBULATORY_CARE_PROVIDER_SITE_OTHER): Payer: Federal, State, Local not specified - PPO | Admitting: Family Medicine

## 2018-11-08 ENCOUNTER — Other Ambulatory Visit: Payer: Self-pay

## 2018-11-08 ENCOUNTER — Telehealth: Payer: Self-pay | Admitting: Family Medicine

## 2018-11-08 VITALS — BP 113/66 | HR 67 | Temp 98.8°F | Ht 64.0 in | Wt 161.0 lb

## 2018-11-08 DIAGNOSIS — G4733 Obstructive sleep apnea (adult) (pediatric): Secondary | ICD-10-CM

## 2018-11-08 DIAGNOSIS — Z951 Presence of aortocoronary bypass graft: Secondary | ICD-10-CM | POA: Diagnosis not present

## 2018-11-08 DIAGNOSIS — Z23 Encounter for immunization: Secondary | ICD-10-CM

## 2018-11-08 DIAGNOSIS — R7301 Impaired fasting glucose: Secondary | ICD-10-CM

## 2018-11-08 DIAGNOSIS — Z9861 Coronary angioplasty status: Secondary | ICD-10-CM

## 2018-11-08 DIAGNOSIS — Z125 Encounter for screening for malignant neoplasm of prostate: Secondary | ICD-10-CM

## 2018-11-08 DIAGNOSIS — I2510 Atherosclerotic heart disease of native coronary artery without angina pectoris: Secondary | ICD-10-CM | POA: Diagnosis not present

## 2018-11-08 NOTE — Progress Notes (Signed)
Established Patient Office Visit  Subjective:  Patient ID: Gregory Frost, male    DOB: Jan 15, 1961  Age: 58 y.o. MRN: [id number]  CC:  Chief Complaint  Patient presents with  . Snoring  . Hyperglycemia    HPI Gregory Frost presents for   F/U OSA - he is still really struggling with finding a comfortable mask that doesn't leak. He has tried several thus far including air fit F 20, air fit in 20.  He says the one that is come the closest is one that mostly covers his mouth and just goes up to the nose.  But then he was not getting a good seal because it did not actually go over or into the nose.  He is found another one online that he thinks might work that he would like to try but just is not sure if insurance will cover it since it is not through the local DME company.  He has noted that he does feel less tired during the daytime since treating his sleep apnea.  Impaired fasting glucose-no increased thirst or urination. No symptoms consistent with hypoglycemia.  He is not exercising regularly but says he has been more active with yard work over the summer.  S/P CABG - no recent CP or SOB. Now following yearly with Cardiology.  Tolerating statin and meds well.  Occasionally still has some soreness in his chest from his CABG surgery.  He says is not really cardiac chest pain.  Is more if he overdoes the muscles themselves.  Past Medical History:  Diagnosis Date  . BACK PAIN W/RADIATION, UNSPECIFIED 12/20/2005   Qualifier: Diagnosis of  By: Esmeralda Arthur    . CAD in native artery 12/20/2005   Qualifier: Diagnosis of  By: Esmeralda Arthur    . GASTROESOPHAGEAL REFLUX, NO ESOPHAGITIS 12/20/2005   Qualifier: Diagnosis of  By: Esmeralda Arthur    . HYPERLIPIDEMIA 12/20/2005   Qualifier: Diagnosis of  By: Esmeralda Arthur    . MI (myocardial infarction) ([place]) 10/17/2005  . MYOCARDIAL INFARCTION, INITIAL EPISODE OF CARE 12/20/2005   Qualifier: Diagnosis of  By: Esmeralda Arthur      Past  Surgical History:  Procedure Laterality Date  . APPENDECTOMY  2006  . LEFT HEART CATH AND CORONARY ANGIOGRAPHY N/A 10/10/2016   Procedure: Left Heart Cath and Coronary Angiography;  Surgeon: Leonie Man, MD;  Location: [facility];  Service: Cardiovascular;  Laterality: N/A;    Family History  Problem Relation Age of Onset  . Heart attack Father   . Hyperlipidemia Father   . Hyperlipidemia Brother     Social History   Socioeconomic History  . Marital status: Married    Spouse name: Dr. Ivory Broad  . Number of children: 2  . Years of education: BA  . Highest education level: Not on file  Occupational History  . Occupation: Primary school teacher    Comment: [employer]  . Financial resource strain: Not on file  . Food insecurity    Worry: Not on file    Inability: Not on file  . Transportation needs    Medical: Not on file    Non-medical: Not on file  Tobacco Use  . Smoking status: Former Smoker    Types: Cigarettes    Quit date: 03/14/1988    Years since quitting: 30.6  . Smokeless tobacco: Never Used  Substance and Sexual Activity  . Alcohol use: Yes  Alcohol/week: 4.0 standard drinks    Types: 2 Glasses of wine, 2 Cans of beer per week  . Drug use: No  . Sexual activity: Yes    Partners: Female  Lifestyle  . Physical activity    Days per week: Not on file    Minutes per session: Not on file  . Stress: Not on file  Relationships  . Social Musician on phone: Not on file    Gets together: Not on file    Attends religious service: Not on file    Active member of club or organization: Not on file    Attends meetings of clubs or organizations: Not on file    Relationship status: Not on file  . Intimate partner violence    Fear of current or ex partner: Not on file    Emotionally abused: Not on file    Physically abused: Not on file    Forced sexual activity: Not on file  Other Topics Concern  . Not on file  Social  History Narrative   2 caffeinated drinks daily. Typically walks for about 20 minutes 5 days per week.    Outpatient Medications Prior to Visit  Medication Sig Dispense Refill  . AMBULATORY NON FORMULARY MEDICATION Medication Name:  CPAP, set CPAP to 13 cm water pressure, then in 2 weeks please send download to [place] at [phone]. Dx OSA, severe. 1 Units 0  . aspirin EC 325 MG tablet Take 325 mg by mouth daily.  0  . diltiazem (CARDIZEM CD) 180 MG 24 hr capsule TAKE 1 CAPSULE BY MOUTH EVERY DAY 90 capsule 1  . Icosapent Ethyl (VASCEPA) 1 g CAPS Take 2 capsules (2 g total) by mouth 2 (two) times daily. 360 capsule PRN  . metoprolol succinate (TOPROL-XL) 25 MG 24 hr tablet Take 1 tablet (25 mg total) by mouth daily. 90 tablet 3  . Milk Thistle 500 MG CAPS Take 1 capsule by mouth daily.     . Multiple Vitamin (MULTIVITAMIN) capsule Take 1 capsule by mouth daily.    . nitroGLYCERIN (NITROSTAT) 0.4 MG SL tablet PLACE 1 TABLET (0.4 MG TOTAL) UNDER THE TONGUE EVERY 5 (FIVE) MINUTES AS NEEDED FOR CHEST PAIN. 25 tablet PRN  . rosuvastatin (CRESTOR) 40 MG tablet Take 1 tablet (40 mg total) by mouth daily. 90 tablet 3  . Ubiquinol 100 MG CAPS Take 1 tablet by mouth daily.     . vitamin C (ASCORBIC ACID) 500 MG tablet Take 500 mg by mouth daily.     No facility-administered medications prior to visit.     Allergies  Allergen Reactions  . Lipitor [Atorvastatin] Other (See Comments)    Myalgias.     ROS Review of Systems    Objective:    Physical Exam  Constitutional: He is oriented to person, place, and time. He appears well-developed and well-nourished.  HENT:  Head: Normocephalic and atraumatic.  Cardiovascular: Normal rate, regular rhythm and normal heart sounds.  Pulmonary/Chest: Effort normal and breath sounds normal.  Neurological: He is alert and oriented to person, place, and time.  Skin: Skin is warm and dry.  Psychiatric: He has a normal mood and affect. His behavior is normal.     BP 113/66   Pulse 67   Temp 98.8 F (37.1 C)   Ht 5\' 4"  (1.626 m)   Wt 161 lb (73 kg)   SpO2 100%   BMI 27.64 kg/m  Wt Readings from Last 3 Encounters:  11/08/18 161 lb (73 kg)  08/07/18 158 lb (71.7 kg)  07/25/18 158 lb (71.7 kg)     Health Maintenance Due  Topic Date Due  . Hepatitis C Screening  Apr 12, 1960  . HIV Screening  01/10/1977    There are no preventive care reminders to display for this patient.  No results found for: TSH Lab Results  Component Value Date   WBC 6.1 10/31/2017   HGB 14.9 10/31/2017   HCT 43.4 10/31/2017   MCV 90.2 10/31/2017   PLT 275 10/31/2017   Lab Results  Component Value Date   NA 141 05/23/2018   K 4.6 05/23/2018   CO2 27 05/23/2018   GLUCOSE 98 05/23/2018   BUN 19 05/23/2018   CREATININE 0.96 05/23/2018   BILITOT 0.4 05/23/2018   ALKPHOS 70 08/22/2016   AST 18 05/23/2018   ALT 28 05/23/2018   PROT 7.0 05/23/2018   ALBUMIN 4.4 08/22/2016   CALCIUM 9.6 05/23/2018   Lab Results  Component Value Date   CHOL 131 05/23/2018   Lab Results  Component Value Date   HDL 37 (L) 05/23/2018   Lab Results  Component Value Date   LDLCALC 68 05/23/2018   Lab Results  Component Value Date   TRIG 182 (H) 05/23/2018   Lab Results  Component Value Date   CHOLHDL 3.5 05/23/2018   Lab Results  Component Value Date   HGBA1C 5.9 (A) 11/08/2018      Assessment & Plan:   Problem List Items Addressed This Visit      Cardiovascular and Mediastinum   CAD S/P percutaneous coronary angioplasty (Chronic)    Stable. Taking meds consistently. Following with Cardiology yearly.        Respiratory   OSA (obstructive sleep apnea)    Will call for download for his CPAP.  It sounds like he is been getting good readings on his home device but still having a lot of difficulty with leaking.  We actually decreased his pressure down to 13 units back at the end of June so just wanted confirm that actually happened and that if it did we  will try turning his pressure down to about 11 cm of water pressure.        Endocrine   IFG (impaired fasting glucose) - Primary    Well controlled. Continue current regimen. Follow up in  6 mo.  Did encourage him to work on creating a more structured and routine exercise regimen.      Relevant Orders   POCT glycosylated hemoglobin (Hb A1C) (Completed)   BASIC METABOLIC PANEL WITH GFR     Other   S/P CABG x 5   Relevant Orders   BASIC METABOLIC PANEL WITH GFR    Other Visit Diagnoses    Need for immunization against influenza       Relevant Orders   Flu Vaccine QUAD 36+ mos IM (Completed)   Special screening, prostate cancer       Relevant Orders   PSA      No orders of the defined types were placed in this encounter.   Follow-up: Return in about 6 months (around 05/11/2019).    Nani Gasseratherine Metheney, MD

## 2018-11-08 NOTE — Assessment & Plan Note (Signed)
Stable. Taking meds consistently. Following with Cardiology yearly.

## 2018-11-08 NOTE — Telephone Encounter (Signed)
Please call for download on his CPAP.  He should be set at 13 cm of water pressure but I just want to confirm this before sending a new order.

## 2018-11-08 NOTE — Assessment & Plan Note (Addendum)
Well controlled. Continue current regimen. Follow up in  6 mo.  Did encourage him to work on creating a more structured and routine exercise regimen.

## 2018-11-08 NOTE — Telephone Encounter (Signed)
Spoke w/Kelly asked that a download be faxed. She will send.Maryruth Eve, Lahoma Crocker, CMA

## 2018-11-08 NOTE — Assessment & Plan Note (Signed)
Will call for download for his CPAP.  It sounds like he is been getting good readings on his home device but still having a lot of difficulty with leaking.  We actually decreased his pressure down to 13 units back at the end of June so just wanted confirm that actually happened and that if it did we will try turning his pressure down to about 11 cm of water pressure.

## 2018-11-09 LAB — POCT GLYCOSYLATED HEMOGLOBIN (HGB A1C): Hemoglobin A1C: 5.9 % — AB (ref 4.0–5.6)

## 2018-11-12 MED ORDER — AMBULATORY NON FORMULARY MEDICATION: 0 refills | Status: AC

## 2018-11-12 NOTE — Telephone Encounter (Signed)
New prescription printed to decrease CPAP down to 11 cm of water pressure.

## 2018-11-14 NOTE — Telephone Encounter (Signed)
This has been faxed.Maryruth Eve, Lahoma Crocker, CMA

## 2018-11-16 DIAGNOSIS — G4733 Obstructive sleep apnea (adult) (pediatric): Secondary | ICD-10-CM | POA: Diagnosis not present

## 2018-11-21 DIAGNOSIS — R7301 Impaired fasting glucose: Secondary | ICD-10-CM | POA: Diagnosis not present

## 2018-11-21 DIAGNOSIS — Z125 Encounter for screening for malignant neoplasm of prostate: Secondary | ICD-10-CM | POA: Diagnosis not present

## 2018-11-21 DIAGNOSIS — Z951 Presence of aortocoronary bypass graft: Secondary | ICD-10-CM | POA: Diagnosis not present

## 2018-11-22 LAB — BASIC METABOLIC PANEL WITH GFR
BUN: 18 mg/dL (ref 7–25)
CO2: 28 mmol/L (ref 20–32)
Calcium: 9.8 mg/dL (ref 8.6–10.3)
Chloride: 104 mmol/L (ref 98–110)
Creat: 1.03 mg/dL (ref 0.70–1.33)
GFR, Est African American: 94 mL/min/{1.73_m2} (ref >=60)
GFR, Est Non African American: 81 mL/min/{1.73_m2} (ref >=60)
Glucose, Bld: 110 mg/dL — ABNORMAL HIGH (ref 65–99)
Potassium: 4.6 mmol/L (ref 3.5–5.3)
Sodium: 141 mmol/L (ref 135–146)

## 2018-11-22 LAB — PSA: PSA: 1.2 ng/mL (ref <=4.0)

## 2018-11-22 NOTE — Progress Notes (Signed)
All labs are normal. 

## 2018-12-03 ENCOUNTER — Other Ambulatory Visit: Payer: Self-pay | Admitting: Family Medicine

## 2018-12-13 ENCOUNTER — Other Ambulatory Visit: Payer: Self-pay | Admitting: Family Medicine

## 2018-12-13 DIAGNOSIS — I2510 Atherosclerotic heart disease of native coronary artery without angina pectoris: Secondary | ICD-10-CM

## 2018-12-16 DIAGNOSIS — G4733 Obstructive sleep apnea (adult) (pediatric): Secondary | ICD-10-CM | POA: Diagnosis not present

## 2019-01-16 DIAGNOSIS — G4733 Obstructive sleep apnea (adult) (pediatric): Secondary | ICD-10-CM | POA: Diagnosis not present

## 2019-02-10 ENCOUNTER — Encounter: Payer: Self-pay | Admitting: Family Medicine

## 2019-02-10 ENCOUNTER — Other Ambulatory Visit: Payer: Self-pay | Admitting: Family Medicine

## 2019-02-11 NOTE — Telephone Encounter (Signed)
Done

## 2019-02-15 DIAGNOSIS — G4733 Obstructive sleep apnea (adult) (pediatric): Secondary | ICD-10-CM | POA: Diagnosis not present

## 2019-03-18 DIAGNOSIS — G4733 Obstructive sleep apnea (adult) (pediatric): Secondary | ICD-10-CM | POA: Diagnosis not present

## 2019-03-26 ENCOUNTER — Other Ambulatory Visit: Payer: Self-pay | Admitting: Family Medicine

## 2019-03-26 DIAGNOSIS — E782 Mixed hyperlipidemia: Secondary | ICD-10-CM

## 2019-03-27 DIAGNOSIS — I2510 Atherosclerotic heart disease of native coronary artery without angina pectoris: Secondary | ICD-10-CM | POA: Diagnosis not present

## 2019-03-27 DIAGNOSIS — Z951 Presence of aortocoronary bypass graft: Secondary | ICD-10-CM | POA: Diagnosis not present

## 2019-03-27 DIAGNOSIS — E785 Hyperlipidemia, unspecified: Secondary | ICD-10-CM | POA: Diagnosis not present

## 2019-04-18 DIAGNOSIS — G4733 Obstructive sleep apnea (adult) (pediatric): Secondary | ICD-10-CM | POA: Diagnosis not present

## 2019-05-06 ENCOUNTER — Telehealth: Payer: Self-pay | Admitting: Family Medicine

## 2019-05-06 ENCOUNTER — Other Ambulatory Visit: Payer: Self-pay

## 2019-05-06 ENCOUNTER — Ambulatory Visit (INDEPENDENT_AMBULATORY_CARE_PROVIDER_SITE_OTHER): Payer: Federal, State, Local not specified - PPO | Admitting: Family Medicine

## 2019-05-06 ENCOUNTER — Encounter: Payer: Self-pay | Admitting: Family Medicine

## 2019-05-06 VITALS — BP 108/65 | HR 74 | Ht 64.0 in | Wt 162.0 lb

## 2019-05-06 DIAGNOSIS — Z9861 Coronary angioplasty status: Secondary | ICD-10-CM | POA: Diagnosis not present

## 2019-05-06 DIAGNOSIS — R7301 Impaired fasting glucose: Secondary | ICD-10-CM | POA: Diagnosis not present

## 2019-05-06 DIAGNOSIS — I2510 Atherosclerotic heart disease of native coronary artery without angina pectoris: Secondary | ICD-10-CM

## 2019-05-06 DIAGNOSIS — R9431 Abnormal electrocardiogram [ECG] [EKG]: Secondary | ICD-10-CM | POA: Diagnosis not present

## 2019-05-06 LAB — POCT GLYCOSYLATED HEMOGLOBIN (HGB A1C): Hemoglobin A1C: 5.5 % (ref 4.0–5.6)

## 2019-05-06 NOTE — Assessment & Plan Note (Signed)
WEll controlled. Back in the normal range. F/u in 6 mo  Lab Results  Component Value Date   HGBA1C 5.5 05/06/2019

## 2019-05-06 NOTE — Telephone Encounter (Signed)
Please call Cardiologist and see if can fax over last 2 ekgs.  Thank you.

## 2019-05-06 NOTE — Progress Notes (Signed)
Established Patient Office Visit  Subjective:  Patient ID: Gregory Frost, male    DOB: 1960/09/07  Age: 59 y.o. MRN: [id number]  CC:  Chief Complaint  Patient presents with  . ifg    HPI Gregory Frost presents for   Impaired fasting glucose-no increased thirst or urination. No symptoms consistent with hypoglycemia.Hasn't been as active this winter.    He did see cardiology and they discontinued his diltiazem and have increased his metoprolol to 50 mg. He feels well on that regimen thus far.     He is concerned  That his last  EKG had a different interpretation than the previous one.  Will call to get copies to see if actual difference.    Past Medical History:  Diagnosis Date  . BACK PAIN W/RADIATION, UNSPECIFIED 12/20/2005   Qualifier: Diagnosis of  By: Esmeralda Arthur    . CAD in native artery 12/20/2005   Qualifier: Diagnosis of  By: Esmeralda Arthur    . GASTROESOPHAGEAL REFLUX, NO ESOPHAGITIS 12/20/2005   Qualifier: Diagnosis of  By: Esmeralda Arthur    . HYPERLIPIDEMIA 12/20/2005   Qualifier: Diagnosis of  By: Esmeralda Arthur    . MI (myocardial infarction) ([facility]) 10/17/2005  . MYOCARDIAL INFARCTION, INITIAL EPISODE OF CARE 12/20/2005   Qualifier: Diagnosis of  By: Esmeralda Arthur      Past Surgical History:  Procedure Laterality Date  . APPENDECTOMY  2006  . LEFT HEART CATH AND CORONARY ANGIOGRAPHY N/A 10/10/2016   Procedure: Left Heart Cath and Coronary Angiography;  Surgeon: Leonie Man, MD;  Location: [facility];  Service: Cardiovascular;  Laterality: N/A;    Family History  Problem Relation Age of Onset  . Heart attack Father   . Hyperlipidemia Father   . Hyperlipidemia Brother     Social History   Socioeconomic History  . Marital status: Married    Spouse name: Dr. Ivory Broad  . Number of children: 2  . Years of education: BA  . Highest education level: Not on file  Occupational History  . Occupation: Primary school teacher     Comment: [employer]  Tobacco Use  . Smoking status: Former Smoker    Types: Cigarettes    Quit date: 03/14/1988    Years since quitting: 31.1  . Smokeless tobacco: Never Used  Substance and Sexual Activity  . Alcohol use: Yes    Alcohol/week: 4.0 standard drinks    Types: 2 Glasses of wine, 2 Cans of beer per week  . Drug use: No  . Sexual activity: Yes    Partners: Female  Other Topics Concern  . Not on file  Social History Narrative   2 caffeinated drinks daily. Typically walks for about 20 minutes 5 days per week.   Social Determinants of Health   Financial Resource Strain:   . Difficulty of Paying Living Expenses: Not on file  Food Insecurity:   . Worried About Charity fundraiser in the Last Year: Not on file  . Ran Out of Food in the Last Year: Not on file  Transportation Needs:   . Lack of Transportation (Medical): Not on file  . Lack of Transportation (Non-Medical): Not on file  Physical Activity:   . Days of Exercise per Week: Not on file  . Minutes of Exercise per Session: Not on file  Stress:   . Feeling of Stress : Not on file  Social Connections:   . Frequency of Communication with Friends  and Family: Not on file  . Frequency of Social Gatherings with Friends and Family: Not on file  . Attends Religious Services: Not on file  . Active Member of Clubs or Organizations: Not on file  . Attends Banker Meetings: Not on file  . Marital Status: Not on file  Intimate Partner Violence:   . Fear of Current or Ex-Partner: Not on file  . Emotionally Abused: Not on file  . Physically Abused: Not on file  . Sexually Abused: Not on file    Outpatient Medications Prior to Visit  Medication Sig Dispense Refill  . metoprolol succinate (TOPROL-XL) 50 MG 24 hr tablet Take 1 tablet by mouth daily.    . AMBULATORY NON FORMULARY MEDICATION Medication Name:  CPAP, set CPAP to 11 cm water pressure, then in 2 weeks please send download to [place] at [phone]. Dx OSA,  severe. 1 Units 0  . aspirin EC 325 MG tablet Take 325 mg by mouth daily.  0  . Milk Thistle 500 MG CAPS Take 1 capsule by mouth daily.     . Multiple Vitamin (MULTIVITAMIN) capsule Take 1 capsule by mouth daily.    . nitroGLYCERIN (NITROSTAT) 0.4 MG SL tablet PLACE 1 TABLET (0.4 MG TOTAL) UNDER THE TONGUE EVERY 5 (FIVE) MINUTES AS NEEDED FOR CHEST PAIN. 25 tablet PRN  . rosuvastatin (CRESTOR) 40 MG tablet TAKE 1 TABLET BY MOUTH EVERY DAY 90 tablet 1  . Ubiquinol 100 MG CAPS Take 1 tablet by mouth daily.     Marland Kitchen VASCEPA 1 g capsule TAKE 2 CAPSULES BY MOUTH TWICE A DAY 360 capsule PRN  . vitamin C (ASCORBIC ACID) 500 MG tablet Take 500 mg by mouth daily.    Marland Kitchen diltiazem (CARDIZEM CD) 180 MG 24 hr capsule TAKE 1 CAPSULE BY MOUTH EVERY DAY 90 capsule 1  . metoprolol succinate (TOPROL-XL) 25 MG 24 hr tablet TAKE 1 TABLET BY MOUTH EVERY DAY 90 tablet 3   No facility-administered medications prior to visit.    Allergies  Allergen Reactions  . Lipitor [Atorvastatin] Other (See Comments)    Myalgias.     ROS Review of Systems    Objective:    Physical Exam  Constitutional: He is oriented to person, place, and time. He appears well-developed and well-nourished.  HENT:  Head: Normocephalic and atraumatic.  Cardiovascular: Normal rate, regular rhythm and normal heart sounds.  Pulmonary/Chest: Effort normal and breath sounds normal.  Neurological: He is alert and oriented to person, place, and time.  Skin: Skin is warm and dry.  Psychiatric: He has a normal mood and affect. His behavior is normal.    BP 108/65   Pulse 74   Ht 5\' 4"  (1.626 m)   Wt 162 lb (73.5 kg)   SpO2 100%   BMI 27.81 kg/m  Wt Readings from Last 3 Encounters:  05/06/19 162 lb (73.5 kg)  11/08/18 161 lb (73 kg)  08/07/18 158 lb (71.7 kg)     Health Maintenance Due  Topic Date Due  . Hepatitis C Screening  1960/05/01  . HIV Screening  01/10/1977    There are no preventive care reminders to display for this  patient.  No results found for: TSH Lab Results  Component Value Date   WBC 6.1 10/31/2017   HGB 14.9 10/31/2017   HCT 43.4 10/31/2017   MCV 90.2 10/31/2017   PLT 275 10/31/2017   Lab Results  Component Value Date   NA 141 11/21/2018   K  4.6 11/21/2018   CO2 28 11/21/2018   GLUCOSE 110 (H) 11/21/2018   BUN 18 11/21/2018   CREATININE 1.03 11/21/2018   BILITOT 0.4 05/23/2018   ALKPHOS 70 08/22/2016   AST 18 05/23/2018   ALT 28 05/23/2018   PROT 7.0 05/23/2018   ALBUMIN 4.4 08/22/2016   CALCIUM 9.8 11/21/2018   Lab Results  Component Value Date   CHOL 131 05/23/2018   Lab Results  Component Value Date   HDL 37 (L) 05/23/2018   Lab Results  Component Value Date   LDLCALC 68 05/23/2018   Lab Results  Component Value Date   TRIG 182 (H) 05/23/2018   Lab Results  Component Value Date   CHOLHDL 3.5 05/23/2018   Lab Results  Component Value Date   HGBA1C 5.5 05/06/2019      Assessment & Plan:   Problem List Items Addressed This Visit      Cardiovascular and Mediastinum   CAD S/P percutaneous coronary angioplasty (Chronic)    Updated med list with changes.  Due for labs.  F/U in 6 mo      Relevant Medications   metoprolol succinate (TOPROL-XL) 50 MG 24 hr tablet   Other Relevant Orders   Lipid panel   COMPLETE METABOLIC PANEL WITH GFR     Endocrine   IFG (impaired fasting glucose) - Primary    WEll controlled. Back in the normal range. F/u in 6 mo  Lab Results  Component Value Date   HGBA1C 5.5 05/06/2019         Relevant Orders   POCT HgB A1C (Completed)   Lipid panel   COMPLETE METABOLIC PANEL WITH GFR    Other Visit Diagnoses    Abnormal EKG          No orders of the defined types were placed in this encounter.   Follow-up: Return in about 6 months (around 11/03/2019) for CAD and A1C .    Nani Gasser, MD

## 2019-05-06 NOTE — Assessment & Plan Note (Signed)
Updated med list with changes.  Due for labs.  F/U in 6 mo

## 2019-05-07 NOTE — Telephone Encounter (Signed)
Last 2 EKGs are from January 2021 and January 2020. Spoke with nurse Therese Sarah who is faxing this over

## 2019-05-16 DIAGNOSIS — G4733 Obstructive sleep apnea (adult) (pediatric): Secondary | ICD-10-CM | POA: Diagnosis not present

## 2019-05-22 DIAGNOSIS — I2510 Atherosclerotic heart disease of native coronary artery without angina pectoris: Secondary | ICD-10-CM | POA: Diagnosis not present

## 2019-05-22 DIAGNOSIS — Z9861 Coronary angioplasty status: Secondary | ICD-10-CM | POA: Diagnosis not present

## 2019-05-22 DIAGNOSIS — R7301 Impaired fasting glucose: Secondary | ICD-10-CM | POA: Diagnosis not present

## 2019-05-23 LAB — COMPLETE METABOLIC PANEL WITH GFR
AG Ratio: 1.8 (calc) (ref 1.0–2.5)
ALT: 48 U/L — ABNORMAL HIGH (ref 9–46)
AST: 26 U/L (ref 10–35)
Albumin: 4.4 g/dL (ref 3.6–5.1)
Alkaline phosphatase (APISO): 54 U/L (ref 35–144)
BUN: 25 mg/dL (ref 7–25)
CO2: 27 mmol/L (ref 20–32)
Calcium: 9.5 mg/dL (ref 8.6–10.3)
Chloride: 106 mmol/L (ref 98–110)
Creat: 0.99 mg/dL (ref 0.70–1.33)
GFR, Est African American: 98 mL/min/{1.73_m2} (ref >=60)
GFR, Est Non African American: 84 mL/min/{1.73_m2} (ref >=60)
Globulin: 2.5 g/dL (calc) (ref 1.9–3.7)
Glucose, Bld: 103 mg/dL — ABNORMAL HIGH (ref 65–99)
Potassium: 4.4 mmol/L (ref 3.5–5.3)
Sodium: 143 mmol/L (ref 135–146)
Total Bilirubin: 0.3 mg/dL (ref 0.2–1.2)
Total Protein: 6.9 g/dL (ref 6.1–8.1)

## 2019-05-23 LAB — LIPID PANEL
Cholesterol: 123 mg/dL (ref <200)
HDL: 43 mg/dL (ref >=40)
LDL Cholesterol (Calc): 55 mg/dL (calc)
Non-HDL Cholesterol (Calc): 80 mg/dL (calc) (ref <130)
Total CHOL/HDL Ratio: 2.9 (calc) (ref <5.0)
Triglycerides: 178 mg/dL — ABNORMAL HIGH (ref <150)

## 2019-05-28 ENCOUNTER — Other Ambulatory Visit: Payer: Self-pay | Admitting: Family Medicine

## 2019-06-13 ENCOUNTER — Telehealth: Payer: Self-pay

## 2019-06-13 NOTE — Telephone Encounter (Signed)
Called and advised that ABX is not needed for routine cleanings.  Was asked to send a note stating this.   Letter written and faxed to [phone]

## 2019-06-13 NOTE — Telephone Encounter (Signed)
Received call from Katie with Dr [facility]. They need to know if patient needs pre-med for dental cleaning or not. I do not see in chart where we have sent in pre-med before.   Please advise.

## 2019-06-13 NOTE — Telephone Encounter (Signed)
He does not need antibiotic prophylaxis for routine cleaning.

## 2019-06-13 NOTE — Telephone Encounter (Signed)
Pt advised of this and stated that he believes that it could be because he will be seen by an Endodontist.   Will fwd to pcp

## 2019-06-16 DIAGNOSIS — G4733 Obstructive sleep apnea (adult) (pediatric): Secondary | ICD-10-CM | POA: Diagnosis not present

## 2019-07-03 ENCOUNTER — Encounter: Payer: Self-pay | Admitting: Family Medicine

## 2019-07-16 DIAGNOSIS — G4733 Obstructive sleep apnea (adult) (pediatric): Secondary | ICD-10-CM | POA: Diagnosis not present

## 2019-08-16 DIAGNOSIS — G4733 Obstructive sleep apnea (adult) (pediatric): Secondary | ICD-10-CM | POA: Diagnosis not present

## 2019-09-15 DIAGNOSIS — G4733 Obstructive sleep apnea (adult) (pediatric): Secondary | ICD-10-CM | POA: Diagnosis not present

## 2019-10-16 DIAGNOSIS — G4733 Obstructive sleep apnea (adult) (pediatric): Secondary | ICD-10-CM | POA: Diagnosis not present

## 2019-11-04 ENCOUNTER — Encounter: Payer: Self-pay | Admitting: Family Medicine

## 2019-11-04 ENCOUNTER — Other Ambulatory Visit: Payer: Self-pay

## 2019-11-04 ENCOUNTER — Ambulatory Visit (INDEPENDENT_AMBULATORY_CARE_PROVIDER_SITE_OTHER): Payer: Federal, State, Local not specified - PPO | Admitting: Family Medicine

## 2019-11-04 ENCOUNTER — Ambulatory Visit (INDEPENDENT_AMBULATORY_CARE_PROVIDER_SITE_OTHER): Payer: Federal, State, Local not specified - PPO

## 2019-11-04 VITALS — BP 138/77 | HR 64 | Ht 64.0 in | Wt 162.0 lb

## 2019-11-04 DIAGNOSIS — M79671 Pain in right foot: Secondary | ICD-10-CM

## 2019-11-04 DIAGNOSIS — R7301 Impaired fasting glucose: Secondary | ICD-10-CM | POA: Diagnosis not present

## 2019-11-04 DIAGNOSIS — Z23 Encounter for immunization: Secondary | ICD-10-CM | POA: Diagnosis not present

## 2019-11-04 DIAGNOSIS — Z9861 Coronary angioplasty status: Secondary | ICD-10-CM

## 2019-11-04 DIAGNOSIS — I2510 Atherosclerotic heart disease of native coronary artery without angina pectoris: Secondary | ICD-10-CM

## 2019-11-04 DIAGNOSIS — M19071 Primary osteoarthritis, right ankle and foot: Secondary | ICD-10-CM | POA: Diagnosis not present

## 2019-11-04 DIAGNOSIS — R748 Abnormal levels of other serum enzymes: Secondary | ICD-10-CM

## 2019-11-04 LAB — POCT GLYCOSYLATED HEMOGLOBIN (HGB A1C): Hemoglobin A1C: 5.7 % — AB (ref 4.0–5.6)

## 2019-11-04 NOTE — Assessment & Plan Note (Signed)
Is with Dr. Ginette Otto.  Continue statin and aspirin.  Labs are up-to-date.

## 2019-11-04 NOTE — Progress Notes (Signed)
Pt is now living in [place].

## 2019-11-04 NOTE — Assessment & Plan Note (Signed)
See is up a little bit from previous at 5.7 it was 5.5 last time.  Just continue to work on healthy diet and regular exercise he has not been cooking as regularly since he has been commuting to [place] for work and he has not been exercising as consistently since then either but plans to get back on track.

## 2019-11-04 NOTE — Progress Notes (Signed)
Established Patient Office Visit  Subjective:  Patient ID: Gregory Frost, male    DOB: 12/13/1960  Age: 59 y.o. MRN: [id number]  CC:  Chief Complaint  Patient presents with  . ifg  . Coronary Artery Disease  . Foot Pain    R foot pain he dropped a wine bottle on foot in june    HPI Gregory Frost presents for   Impaired fasting glucose-no increased thirst or urination. No symptoms consistent with hypoglycemia.   F/U CAD -recent chest pain or shortness of breath.  He still taking his statin and aspirin regularly.  Follows with cardiology once a year.  Follows with Dr. Ginette Otto.  Right foot pain since June.  Accidentally dropped a bottle of wine on his foot while at [employer].  He says it did not bruise right after the injury but since then it just been a little bit painful and bothersome sometimes he can walk and I have any discomfort and then other times it does bother him it tends to be over the distal part of the foot over the ball of the foot.  He says it almost feels like there is something in his shoe when he walks.  He said he even bought new shoes with more cushion and support and it does not seem to be helping.   Past Medical History:  Diagnosis Date  . BACK PAIN W/RADIATION, UNSPECIFIED 12/20/2005   Qualifier: Diagnosis of  By: Thomos Lemons    . CAD in native artery 12/20/2005   Qualifier: Diagnosis of  By: Thomos Lemons    . GASTROESOPHAGEAL REFLUX, NO ESOPHAGITIS 12/20/2005   Qualifier: Diagnosis of  By: Thomos Lemons    . HYPERLIPIDEMIA 12/20/2005   Qualifier: Diagnosis of  By: Thomos Lemons    . MI (myocardial infarction) ([facility]) 10/17/2005  . MYOCARDIAL INFARCTION, INITIAL EPISODE OF CARE 12/20/2005   Qualifier: Diagnosis of  By: Thomos Lemons      Past Surgical History:  Procedure Laterality Date  . APPENDECTOMY  2006  . LEFT HEART CATH AND CORONARY ANGIOGRAPHY N/A 10/10/2016   Procedure: Left Heart Cath and Coronary Angiography;  Surgeon:  Marykay Lex, MD;  Location: [facility];  Service: Cardiovascular;  Laterality: N/A;    Family History  Problem Relation Age of Onset  . Heart attack Father   . Hyperlipidemia Father   . Hyperlipidemia Brother     Social History   Socioeconomic History  . Marital status: Married    Spouse name: Dr. Eden Lathe  . Number of children: 2  . Years of education: BA  . Highest education level: Not on file  Occupational History  . Occupation: Engineer, building services    Comment: [employer]  Tobacco Use  . Smoking status: Former Smoker    Types: Cigarettes    Quit date: 03/14/1988    Years since quitting: 31.6  . Smokeless tobacco: Never Used  Vaping Use  . Vaping Use: Never used  Substance and Sexual Activity  . Alcohol use: Yes    Alcohol/week: 4.0 standard drinks    Types: 2 Glasses of wine, 2 Cans of beer per week  . Drug use: No  . Sexual activity: Yes    Partners: Female  Other Topics Concern  . Not on file  Social History Narrative   2 caffeinated drinks daily. Typically walks for about 20 minutes 5 days per week.   Social Determinants of Corporate investment banker  Strain:   . Difficulty of Paying Living Expenses: Not on file  Food Insecurity:   . Worried About Programme researcher, broadcasting/film/video in the Last Year: Not on file  . Ran Out of Food in the Last Year: Not on file  Transportation Needs:   . Lack of Transportation (Medical): Not on file  . Lack of Transportation (Non-Medical): Not on file  Physical Activity:   . Days of Exercise per Week: Not on file  . Minutes of Exercise per Session: Not on file  Stress:   . Feeling of Stress : Not on file  Social Connections:   . Frequency of Communication with Friends and Family: Not on file  . Frequency of Social Gatherings with Friends and Family: Not on file  . Attends Religious Services: Not on file  . Active Member of Clubs or Organizations: Not on file  . Attends Banker Meetings: Not on file  .  Marital Status: Not on file  Intimate Partner Violence:   . Fear of Current or Ex-Partner: Not on file  . Emotionally Abused: Not on file  . Physically Abused: Not on file  . Sexually Abused: Not on file    Outpatient Medications Prior to Visit  Medication Sig Dispense Refill  . AMBULATORY NON FORMULARY MEDICATION Medication Name:  CPAP, set CPAP to 11 cm water pressure, then in 2 weeks please send download to [place] at [phone]. Dx OSA, severe. 1 Units 0  . aspirin EC 325 MG tablet Take 325 mg by mouth daily.  0  . metoprolol succinate (TOPROL-XL) 50 MG 24 hr tablet Take 1 tablet by mouth daily.    . Milk Thistle 500 MG CAPS Take 1 capsule by mouth daily.     . Multiple Vitamin (MULTIVITAMIN) capsule Take 1 capsule by mouth daily.    . nitroGLYCERIN (NITROSTAT) 0.4 MG SL tablet PLACE 1 TABLET (0.4 MG TOTAL) UNDER THE TONGUE EVERY 5 (FIVE) MINUTES AS NEEDED FOR CHEST PAIN. 25 tablet PRN  . rosuvastatin (CRESTOR) 40 MG tablet TAKE 1 TABLET BY MOUTH EVERY DAY 90 tablet 1  . Ubiquinol 100 MG CAPS Take 1 tablet by mouth daily.     Marland Kitchen VASCEPA 1 g capsule TAKE 2 CAPSULES BY MOUTH TWICE A DAY 360 capsule PRN  . vitamin C (ASCORBIC ACID) 500 MG tablet Take 500 mg by mouth daily.     No facility-administered medications prior to visit.    Allergies  Allergen Reactions  . Lipitor [Atorvastatin] Other (See Comments)    Myalgias.     ROS Review of Systems    Objective:    Physical Exam Constitutional:      Appearance: He is well-developed.  HENT:     Head: Normocephalic and atraumatic.  Cardiovascular:     Rate and Rhythm: Normal rate and regular rhythm.     Heart sounds: Normal heart sounds.  Pulmonary:     Effort: Pulmonary effort is normal.     Breath sounds: Normal breath sounds.  Musculoskeletal:     Comments: Right foot with no redness and rash.  DP and post Tib pulse 1+.  No swelling or bruising.  Tender over the distal third and fourth metatarsal heads.  Skin:    General:  Skin is warm and dry.  Neurological:     Mental Status: He is alert and oriented to person, place, and time.  Psychiatric:        Behavior: Behavior normal.     BP 138/77  Pulse 64   Ht 5\' 4"  (1.626 m)   Wt 162 lb (73.5 kg)   SpO2 100%   BMI 27.81 kg/m  Wt Readings from Last 3 Encounters:  11/04/19 162 lb (73.5 kg)  05/06/19 162 lb (73.5 kg)  11/08/18 161 lb (73 kg)     Health Maintenance Due  Topic Date Due  . Hepatitis C Screening  Never done  . HIV Screening  Never done  . Fecal DNA (Cologuard)  10/13/2019    There are no preventive care reminders to display for this patient.  No results found for: TSH Lab Results  Component Value Date   WBC 6.1 10/31/2017   HGB 14.9 10/31/2017   HCT 43.4 10/31/2017   MCV 90.2 10/31/2017   PLT 275 10/31/2017   Lab Results  Component Value Date   NA 143 05/22/2019   K 4.4 05/22/2019   CO2 27 05/22/2019   GLUCOSE 103 (H) 05/22/2019   BUN 25 05/22/2019   CREATININE 0.99 05/22/2019   BILITOT 0.3 05/22/2019   ALKPHOS 70 08/22/2016   AST 26 05/22/2019   ALT 48 (H) 05/22/2019   PROT 6.9 05/22/2019   ALBUMIN 4.4 08/22/2016   CALCIUM 9.5 05/22/2019   Lab Results  Component Value Date   CHOL 123 05/22/2019   Lab Results  Component Value Date   HDL 43 05/22/2019   Lab Results  Component Value Date   LDLCALC 55 05/22/2019   Lab Results  Component Value Date   TRIG 178 (H) 05/22/2019   Lab Results  Component Value Date   CHOLHDL 2.9 05/22/2019   Lab Results  Component Value Date   HGBA1C 5.7 (A) 11/04/2019      Assessment & Plan:   Problem List Items Addressed This Visit      Cardiovascular and Mediastinum   CAD S/P percutaneous coronary angioplasty (Chronic)    Is with Dr. 11/06/2019.  Continue statin and aspirin.  Labs are up-to-date.        Endocrine   IFG (impaired fasting glucose) - Primary    See is up a little bit from previous at 5.7 it was 5.5 last time.  Just continue to work on  healthy diet and regular exercise he has not been cooking as regularly since he has been commuting to [place] for work and he has not been exercising as consistently since then either but plans to get back on track.      Relevant Orders   POCT glycosylated hemoglobin (Hb A1C) (Completed)    Other Visit Diagnoses    Need for immunization against influenza       Relevant Orders   Flu Vaccine QUAD 36+ mos IM (Completed)   Elevated liver enzymes       Relevant Orders   COMPLETE METABOLIC PANEL WITH GFR   Right foot pain       Relevant Orders   DG Foot Complete Right      's labs approximately 6 months ago showed a mild elevation in one of his liver enzymes we will plan to recheck that again today.  Right foot pain x2 months after injury.  Recommend x-ray for further work-up.  He has been wearing more supportive shoes.  Will call with results once available.  Consider podiatry and/or Ortho referral if not improving.  No orders of the defined types were placed in this encounter.   Follow-up: Return in about 6 months (around 05/06/2020) for A1C and full labs .  Beatrice Lecher, MD

## 2019-11-05 LAB — COMPLETE METABOLIC PANEL WITH GFR
AG Ratio: 1.8 (calc) (ref 1.0–2.5)
ALT: 39 U/L (ref 9–46)
AST: 27 U/L (ref 10–35)
Albumin: 4.6 g/dL (ref 3.6–5.1)
Alkaline phosphatase (APISO): 51 U/L (ref 35–144)
BUN: 25 mg/dL (ref 7–25)
CO2: 27 mmol/L (ref 20–32)
Calcium: 9.4 mg/dL (ref 8.6–10.3)
Chloride: 107 mmol/L (ref 98–110)
Creat: 0.97 mg/dL (ref 0.70–1.33)
GFR, Est African American: 100 mL/min/{1.73_m2} (ref >=60)
GFR, Est Non African American: 86 mL/min/{1.73_m2} (ref >=60)
Globulin: 2.5 g/dL (calc) (ref 1.9–3.7)
Glucose, Bld: 99 mg/dL (ref 65–139)
Potassium: 4.2 mmol/L (ref 3.5–5.3)
Sodium: 141 mmol/L (ref 135–146)
Total Bilirubin: 0.4 mg/dL (ref 0.2–1.2)
Total Protein: 7.1 g/dL (ref 6.1–8.1)

## 2019-11-05 NOTE — Progress Notes (Signed)
All labs are normal. 

## 2019-12-31 ENCOUNTER — Other Ambulatory Visit: Payer: Self-pay | Admitting: Family Medicine

## 2020-01-04 ENCOUNTER — Other Ambulatory Visit: Payer: Self-pay | Admitting: Family Medicine

## 2020-01-04 DIAGNOSIS — E785 Hyperlipidemia, unspecified: Secondary | ICD-10-CM | POA: Diagnosis not present

## 2020-01-04 DIAGNOSIS — I10 Essential (primary) hypertension: Secondary | ICD-10-CM | POA: Diagnosis not present

## 2020-01-04 DIAGNOSIS — R079 Chest pain, unspecified: Secondary | ICD-10-CM | POA: Diagnosis not present

## 2020-01-04 DIAGNOSIS — Z7982 Long term (current) use of aspirin: Secondary | ICD-10-CM | POA: Diagnosis not present

## 2020-01-04 DIAGNOSIS — I252 Old myocardial infarction: Secondary | ICD-10-CM | POA: Diagnosis not present

## 2020-01-04 DIAGNOSIS — Z951 Presence of aortocoronary bypass graft: Secondary | ICD-10-CM | POA: Diagnosis not present

## 2020-01-04 DIAGNOSIS — R0789 Other chest pain: Secondary | ICD-10-CM | POA: Diagnosis not present

## 2020-01-04 DIAGNOSIS — Z79899 Other long term (current) drug therapy: Secondary | ICD-10-CM | POA: Diagnosis not present

## 2020-01-04 DIAGNOSIS — I2510 Atherosclerotic heart disease of native coronary artery without angina pectoris: Secondary | ICD-10-CM | POA: Diagnosis not present

## 2020-01-04 DIAGNOSIS — R9431 Abnormal electrocardiogram [ECG] [EKG]: Secondary | ICD-10-CM | POA: Diagnosis not present

## 2020-01-04 DIAGNOSIS — M79602 Pain in left arm: Secondary | ICD-10-CM | POA: Diagnosis not present

## 2020-03-31 ENCOUNTER — Other Ambulatory Visit: Payer: Self-pay | Admitting: Family Medicine

## 2020-03-31 DIAGNOSIS — E782 Mixed hyperlipidemia: Secondary | ICD-10-CM

## 2020-05-04 ENCOUNTER — Ambulatory Visit: Payer: Federal, State, Local not specified - PPO | Admitting: Family Medicine

## 2020-05-15 DIAGNOSIS — M79673 Pain in unspecified foot: Secondary | ICD-10-CM | POA: Diagnosis not present

## 2020-05-15 DIAGNOSIS — Z1211 Encounter for screening for malignant neoplasm of colon: Secondary | ICD-10-CM | POA: Diagnosis not present

## 2020-05-15 DIAGNOSIS — Z951 Presence of aortocoronary bypass graft: Secondary | ICD-10-CM | POA: Diagnosis not present

## 2020-05-15 DIAGNOSIS — I2510 Atherosclerotic heart disease of native coronary artery without angina pectoris: Secondary | ICD-10-CM | POA: Diagnosis not present

## 2020-06-10 DIAGNOSIS — E785 Hyperlipidemia, unspecified: Secondary | ICD-10-CM | POA: Diagnosis not present

## 2020-06-10 DIAGNOSIS — I2510 Atherosclerotic heart disease of native coronary artery without angina pectoris: Secondary | ICD-10-CM | POA: Diagnosis not present

## 2020-06-10 DIAGNOSIS — I10 Essential (primary) hypertension: Secondary | ICD-10-CM | POA: Diagnosis not present

## 2020-06-29 ENCOUNTER — Other Ambulatory Visit: Payer: Self-pay | Admitting: Family Medicine

## 2020-09-17 DIAGNOSIS — K080 Exfoliation of teeth due to systemic causes: Secondary | ICD-10-CM | POA: Diagnosis not present

## 2020-10-13 DIAGNOSIS — H2513 Age-related nuclear cataract, bilateral: Secondary | ICD-10-CM | POA: Diagnosis not present

## 2021-01-04 DIAGNOSIS — R051 Acute cough: Secondary | ICD-10-CM | POA: Diagnosis not present

## 2021-01-28 DIAGNOSIS — R0989 Other specified symptoms and signs involving the circulatory and respiratory systems: Secondary | ICD-10-CM | POA: Diagnosis not present

## 2021-01-28 DIAGNOSIS — R052 Subacute cough: Secondary | ICD-10-CM | POA: Diagnosis not present

## 2021-01-28 DIAGNOSIS — R1314 Dysphagia, pharyngoesophageal phase: Secondary | ICD-10-CM | POA: Diagnosis not present

## 2021-02-11 DIAGNOSIS — K080 Exfoliation of teeth due to systemic causes: Secondary | ICD-10-CM | POA: Diagnosis not present

## 2021-02-11 DIAGNOSIS — R0689 Other abnormalities of breathing: Secondary | ICD-10-CM | POA: Diagnosis not present

## 2021-03-05 ENCOUNTER — Encounter: Payer: Self-pay | Admitting: Family Medicine
